# Patient Record
Sex: Female | Born: 1975
Health system: Southern US, Community
[De-identification: ages and names within clinical notes are randomized; demographics above are authoritative.]

## PROBLEM LIST (undated history)

## (undated) DIAGNOSIS — D229 Melanocytic nevi, unspecified: Secondary | ICD-10-CM

## (undated) DIAGNOSIS — M6208 Separation of muscle (nontraumatic), other site: Secondary | ICD-10-CM

## (undated) DIAGNOSIS — Z3490 Encounter for supervision of normal pregnancy, unspecified, unspecified trimester: Secondary | ICD-10-CM

## (undated) DIAGNOSIS — Z8759 Personal history of other complications of pregnancy, childbirth and the puerperium: Secondary | ICD-10-CM

## (undated) HISTORY — DX: Personal history of other complications of pregnancy, childbirth and the puerperium: Z87.59

## (undated) HISTORY — DX: Melanocytic nevi, unspecified: D22.9

## (undated) HISTORY — DX: Separation of muscle (nontraumatic), other site: M62.08

---

## 2006-04-30 LAB — HM MAMMOGRAPHY: HM Mammogram: NEGATIVE

## 2008-04-30 HISTORY — PX: ECTOPIC PREGNANCY SURGERY: SHX613

## 2009-05-08 ENCOUNTER — Inpatient Hospital Stay (HOSPITAL_COMMUNITY): Admission: AD | Admit: 2009-05-08 | Discharge: 2009-05-12 | Payer: Self-pay | Admitting: Obstetrics and Gynecology

## 2010-07-16 LAB — COMPREHENSIVE METABOLIC PANEL
ALT: 19 U/L (ref 0–35)
AST: 39 U/L — ABNORMAL HIGH (ref 0–37)
Albumin: 2.3 g/dL — ABNORMAL LOW (ref 3.5–5.2)
Alkaline Phosphatase: 185 U/L — ABNORMAL HIGH (ref 39–117)
Calcium: 8.8 mg/dL (ref 8.4–10.5)
GFR calc Af Amer: >60 mL/min (ref >60)
Glucose, Bld: 98 mg/dL (ref 70–99)
Potassium: 4.4 mEq/L (ref 3.5–5.1)
Sodium: 133 mEq/L — ABNORMAL LOW (ref 135–145)
Total Protein: 5.8 g/dL — ABNORMAL LOW (ref 6.0–8.3)

## 2010-07-16 LAB — CBC
HCT: 31.7 % — ABNORMAL LOW (ref 36.0–46.0)
HCT: 33 % — ABNORMAL LOW (ref 36.0–46.0)
Hemoglobin: 11.2 g/dL — ABNORMAL LOW (ref 12.0–15.0)
Hemoglobin: 9.6 g/dL — ABNORMAL LOW (ref 12.0–15.0)
MCHC: 33.8 g/dL (ref 30.0–36.0)
MCHC: 34.1 g/dL (ref 30.0–36.0)
Platelets: 170 10*3/uL (ref 150–400)
Platelets: 190 10*3/uL (ref 150–400)
Platelets: 268 10*3/uL (ref 150–400)
RDW: 15.3 % (ref 11.5–15.5)
RDW: 15.7 % — ABNORMAL HIGH (ref 11.5–15.5)
WBC: 5.9 10*3/uL (ref 4.0–10.5)

## 2010-07-16 LAB — RH IMMUNE GLOB WKUP(>/=20WKS)(NOT WOMEN'S HOSP): Fetal Screen: NEGATIVE

## 2010-10-24 ENCOUNTER — Other Ambulatory Visit (INDEPENDENT_AMBULATORY_CARE_PROVIDER_SITE_OTHER): Payer: BC Managed Care – PPO

## 2010-10-24 ENCOUNTER — Ambulatory Visit (INDEPENDENT_AMBULATORY_CARE_PROVIDER_SITE_OTHER): Payer: BC Managed Care – PPO | Admitting: Gastroenterology

## 2010-10-24 ENCOUNTER — Encounter: Payer: Self-pay | Admitting: Gastroenterology

## 2010-10-24 DIAGNOSIS — R109 Unspecified abdominal pain: Secondary | ICD-10-CM

## 2010-10-24 DIAGNOSIS — K469 Unspecified abdominal hernia without obstruction or gangrene: Secondary | ICD-10-CM

## 2010-10-24 LAB — CBC WITH DIFFERENTIAL/PLATELET
Basophils Absolute: 0 10*3/uL (ref 0.0–0.1)
HCT: 35.3 % — ABNORMAL LOW (ref 36.0–46.0)
Hemoglobin: 11.9 g/dL — ABNORMAL LOW (ref 12.0–15.0)
Lymphs Abs: 1.9 10*3/uL (ref 0.7–4.0)
MCHC: 33.8 g/dL (ref 30.0–36.0)
MCV: 83.9 fl (ref 78.0–100.0)
Monocytes Absolute: 0.3 10*3/uL (ref 0.1–1.0)
Monocytes Relative: 4.4 % (ref 3.0–12.0)
Neutro Abs: 5.3 10*3/uL (ref 1.4–7.7)
Platelets: 370 10*3/uL (ref 150.0–400.0)
RDW: 13.3 % (ref 11.5–14.6)

## 2010-10-24 LAB — COMPREHENSIVE METABOLIC PANEL
ALT: 13 U/L (ref 0–35)
Alkaline Phosphatase: 71 U/L (ref 39–117)
CO2: 28 mEq/L (ref 19–32)
Creatinine, Ser: 0.5 mg/dL (ref 0.4–1.2)
GFR: 137.19 mL/min (ref >60.00)
Sodium: 139 mEq/L (ref 135–145)
Total Bilirubin: 0.3 mg/dL (ref 0.3–1.2)
Total Protein: 7.1 g/dL (ref 6.0–8.3)

## 2010-10-24 NOTE — Patient Instructions (Addendum)
You will be set up for a CT scan of abdomen and pelvis with IV and oral contrast for abdominal pain, ? abd wall hernia. We will set up referral to [facility] for likely symptomatic hernia. You will have labs checked today in the basement lab.  Please head down after you check out with the front desk  (cbc, cmet). You have been scheduled for a CT scan of the abdomen and pelvis at [facility] ([address]---this is in the same building as Architectural technologist).   You are scheduled on  10/25/10 at 10 am. You should arrive 15 minutes prior to your appointment time for registration. Please follow the written instructions below on the day of your exam:  1) Do not eat or drink anything after 6 am  (4 hours prior to your test) 2) You have been given 2 bottles of oral contrast to drink. The solution may taste better if refrigerated, but do NOT add ice or any other liquid to this solution. Shake             well before drinking.  Drink 1 bottle of contrast @ 8 am (2 hours prior to your exam)  Drink 1 bottle of contrast @ 7 am (1 hour prior to your exam)  You may take any medications as prescribed with a small amount of water except for the following: Metformin, Glucophage, Glucovance, Avandamet, Riomet, Fortamet, Actoplus Met, Janumet, Glumetza or Metaglip. The above medications must be held the day of the exam and 48 hours after the exam.  The purpose of you drinking the oral contrast is to aid in the visualization of your intestinal tract. The contrast solution may cause some diarrhea. Before your exam is started, you will be given a small amount of fluid to drink. Depending on your individual set of symptoms, you may also receive an intravenous injection of x-ray contrast/dye.  If you have any questions regarding your exam or if you need to reschedule, you may call the CT department at [phone] between the hours of 8:00 am and 5:00 pm,  Monday-Friday.  ________________________________________________________________________

## 2010-10-24 NOTE — Progress Notes (Signed)
HPI: This is a  very pleasant 35 year old woman who has had 4-5 months sever epigastric discomfort, feels a hard lump (ball) post prandially.  No particular foods cause it.  Just about every meal will cause it.  She is nervous that she may have a tumor.  Overall she has lost weight recently.  Will hurt for 2 hours.  Passing gas helps.  Belching helps somewhat.  She has not had any imaging or labs for it.    She has had friends examine her belly when she has this episode and they can also feel a protruding lump.  She gets pyrosis periodically 1 every 2 weeks (late or spicey food).  Now she takes tums as needed.  No dysphagia.  No vomiting.  No overt GI bleeding.  No stomach problems in family  c section 1 year ago.    Ectopic pregnancy a year prior  She will sometimes eat late at night.    Review of systems: Pertinent positive and negative review of systems were noted in the above HPI section.  All other review of systems was otherwise negative.   Past Medical History  Diagnosis Date  . Hx of ectopic pregnancy     Past Surgical History  Procedure Date  . Cesarean section     x 1   . Ectopic pregnancy surgery      reports that she has never smoked. She has never used smokeless tobacco. She reports that she drinks alcohol. She reports that she does not use illicit drugs.  family history is negative for Colon cancer.    Current Medications, Allergies were all reviewed with the patient via [facility] electronic medical record system.    Physical Exam: BP 102/64  Pulse 88  Ht 5\' 2"  (1.575 m)  Wt 125 lb (56.7 kg)  BMI 22.86 kg/m2 Constitutional: generally well-appearing Psychiatric: alert and oriented x3 Eyes: extraocular movements intact Mouth: oral pharynx moist, no lesions Neck: supple no lymphadenopathy Cardiovascular: heart regular rate and rhythm Lungs: clear to auscultation bilaterally Abdomen: soft, nontender, nondistended, no obvious ascites, no peritoneal  signs, normal bowel sounds: Very lax midline abdominal wall muscles with fairly clear defect periumbilically Extremities: no lower extremity edema bilaterally Skin: no lesions on visible extremities    Assessment and plan: 34 y.o. female with intermittent abdominal discomfort, likely from symptomatic abdominal wall, periumbilical hernia  I think her discomforts are all from an symptomatic hernia in her abdominal wall, periumbilically. She is very nervous about the chances that she has a tumor or some sort of cancer which I think is highly unlikely but a CT scan is not unreasonable to rule out other causes as well. We will set a CT scan up for her after getting basic labs including a CBC complete metabolic profile. We will also begin to arrange for Gen. surgical referral.

## 2010-10-27 ENCOUNTER — Telehealth: Payer: Self-pay | Admitting: Gastroenterology

## 2010-10-27 ENCOUNTER — Ambulatory Visit (INDEPENDENT_AMBULATORY_CARE_PROVIDER_SITE_OTHER)
Admission: RE | Admit: 2010-10-27 | Discharge: 2010-10-27 | Disposition: A | Discharge status: Home / Self Care | Payer: BC Managed Care – PPO | Source: Ambulatory Visit | Attending: Gastroenterology | Admitting: Gastroenterology

## 2010-10-27 DIAGNOSIS — K469 Unspecified abdominal hernia without obstruction or gangrene: Secondary | ICD-10-CM

## 2010-10-27 DIAGNOSIS — R109 Unspecified abdominal pain: Secondary | ICD-10-CM

## 2010-10-27 MED ORDER — IOHEXOL 300 MG/ML  SOLN
100.0000 mL | Freq: Once | INTRAMUSCULAR | Status: AC | PRN
  Administered 2010-10-27: 100 mL via INTRAVENOUS

## 2010-10-27 NOTE — Telephone Encounter (Signed)
Left message on machine to call back  

## 2010-10-27 NOTE — Telephone Encounter (Signed)
Pt aware results not available yet, I will call her as soon as Dr Christella Hartigan reviews

## 2010-11-13 ENCOUNTER — Ambulatory Visit (INDEPENDENT_AMBULATORY_CARE_PROVIDER_SITE_OTHER): Payer: BC Managed Care – PPO | Admitting: Surgery

## 2010-11-13 ENCOUNTER — Encounter (INDEPENDENT_AMBULATORY_CARE_PROVIDER_SITE_OTHER): Payer: Self-pay | Admitting: Surgery

## 2010-11-13 VITALS — BP 98/70 | HR 68 | Temp 96.8°F | Ht 62.5 in | Wt 125.6 lb

## 2010-11-13 DIAGNOSIS — M6208 Separation of muscle (nontraumatic), other site: Secondary | ICD-10-CM

## 2010-11-13 DIAGNOSIS — M6200 Separation of muscle (nontraumatic), unspecified site: Secondary | ICD-10-CM

## 2010-11-13 DIAGNOSIS — K439 Ventral hernia without obstruction or gangrene: Secondary | ICD-10-CM

## 2010-11-13 HISTORY — DX: Separation of muscle (nontraumatic), other site: M62.08

## 2010-11-13 NOTE — Progress Notes (Signed)
Subjective:     Patient ID: Shelley Long, female   DOB: 1975/08/26, 35 y.o.   MRN: [id number]  HPI  The patient notes a 6 month history of periumbilical pain after eating. She notes swelling there. She was worried that there was a mass there.  Friends and family have noticed as well. She is worried she may have a tumor. She saw Dr. Christella Hartigan. He is concerned hernia. He got a CAT scan which did not show a definite hernia but some laxity periumbilically. He sent the patient to me for consideration of surgery for a hernia.  Patient denies any early satiety. No dysphasia. She had bad heartburn when she was pregnant with her child 2011, she does not feel that this is consistent with heartburn. She does know that spicy foods do bother her. She's not tried any medications for this. She often we'll get some burping and belching.  The pain starts in the abdomen. It then becomes more diffuse  centrally. It comes on quickly and last for several hours. There is no associated constipation or diarrhea. No nausea or vomiting. The pain does not radiate anywhere. It's in the middle. Does not go to the back. Is not associated really with running her activity. She is otherwise quite healthy.  Review of Systems  Constitutional: Negative for fever, chills, diaphoresis, appetite change and fatigue.  HENT: Positive for sore throat. Negative for nosebleeds, mouth sores, neck pain and neck stiffness.        ?Cyclical sore throat - stops after menses  Eyes: Negative for photophobia, discharge and visual disturbance.  Respiratory: Negative for cough, choking, chest tightness and shortness of breath.   Cardiovascular: Negative for chest pain and palpitations.  Gastrointestinal: Positive for abdominal pain and abdominal distention. Negative for nausea, vomiting, diarrhea, constipation, blood in stool, anal bleeding and rectal pain.       No dysphagia.  ?Mild reflux - not as bad as when pregnant.  Mild, taking no meds    Genitourinary: Negative for dysuria, urgency, frequency, hematuria, flank pain, vaginal bleeding, vaginal discharge, difficulty urinating, menstrual problem and pelvic pain.  Musculoskeletal: Negative for back pain, arthralgias and gait problem.  Skin: Negative for color change, pallor and rash.  Neurological: Negative for dizziness, speech difficulty, weakness and numbness.  Hematological: Negative for adenopathy. Does not bruise/bleed easily.  Psychiatric/Behavioral: Negative for confusion and agitation. The patient is not nervous/anxious.        Objective:   Physical Exam  Constitutional: She is oriented to person, place, and time. She appears well-developed and well-nourished. No distress.  HENT:  Head: Normocephalic.  Mouth/Throat: Oropharynx is clear and moist. No oropharyngeal exudate.  Eyes: Conjunctivae and EOM are normal. Pupils are equal, round, and reactive to light. No scleral icterus.  Neck: Normal range of motion. Neck supple. No tracheal deviation present.  Cardiovascular: Normal rate, regular rhythm and intact distal pulses.   Pulmonary/Chest: Effort normal and breath sounds normal. No respiratory distress. She exhibits no tenderness.  Abdominal: Soft. Bowel sounds are normal. She exhibits no distension. There is tenderness. There is no rebound and no guarding. Hernia confirmed negative in the right inguinal area and confirmed negative in the left inguinal area.       Periumb diastsis recti persistent while standing.  VWH within this.  Soft/reducible.  Post-partum stretch marks  Genitourinary: No vaginal discharge found.  Musculoskeletal: Normal range of motion. She exhibits no tenderness.  Lymphadenopathy:    She has no cervical adenopathy.  Right: No inguinal adenopathy present.       Left: No inguinal adenopathy present.  Neurological: She is alert and oriented to person, place, and time. No cranial nerve deficit. She exhibits normal muscle tone. Coordination  normal.  Skin: Skin is warm and dry. No rash noted. She is not diaphoretic. No erythema.  Psychiatric: She has a normal mood and affect. Her behavior is normal. Judgment and thought content normal.   CT scan does not show any obstruction. They did not see definite hernia. However I see laxity periumbilically that makes me suspect that she does. Small pancreatic cystic mass. Agree with followup in a year. No other tumors or concerns.    Assessment:     Periumbilical ventral hernia in the setting of diastasis recti.  Abdominal pain postprandial question reflux for rated versus mildly obstructive symptoms.   Plan:     I think she would benefit from earlier.. Is a good candidate for a laparoscopic approach. I may be able to primarily fix the diastases and then do an underlay repair.  I'm concerned that this will not solve all her symptoms. I think it needed to try to do a trial of PPI (omeprazole x3 weeks b.i.d.). If tat resolves all her symptoms, then that would be helpful. However I don't think it will solve all the issues..  With her belching and her postprandial pain I wonder about delayed gastric emptying her upper and reflex, but Dr. Christella Hartigan do not feel that that was issue.   It does not change the fact that she will need earlier. Some point. She will think about things and let me know.

## 2010-11-13 NOTE — Patient Instructions (Signed)
Try omeprazole (ex: Prilosec) 2x/day for 3 weeks to rule out reflux.  Maalox for burping/gassiness. Consider surgery to repair the ventral wall abdominal hernia in the setting of diastasis recti.

## 2010-12-19 ENCOUNTER — Encounter: Payer: Self-pay | Admitting: Internal Medicine

## 2010-12-19 ENCOUNTER — Ambulatory Visit (INDEPENDENT_AMBULATORY_CARE_PROVIDER_SITE_OTHER): Payer: BC Managed Care – PPO | Admitting: Internal Medicine

## 2010-12-19 VITALS — BP 100/60 | HR 59 | Temp 97.9°F | Resp 16 | Ht 59.0 in | Wt 126.0 lb

## 2010-12-19 DIAGNOSIS — K862 Cyst of pancreas: Secondary | ICD-10-CM

## 2010-12-19 DIAGNOSIS — N644 Mastodynia: Secondary | ICD-10-CM

## 2010-12-19 DIAGNOSIS — R109 Unspecified abdominal pain: Secondary | ICD-10-CM | POA: Insufficient documentation

## 2010-12-19 DIAGNOSIS — R5381 Other malaise: Secondary | ICD-10-CM

## 2010-12-19 DIAGNOSIS — D649 Anemia, unspecified: Secondary | ICD-10-CM

## 2010-12-19 DIAGNOSIS — Z803 Family history of malignant neoplasm of breast: Secondary | ICD-10-CM

## 2010-12-19 DIAGNOSIS — R5383 Other fatigue: Secondary | ICD-10-CM

## 2010-12-19 NOTE — Patient Instructions (Signed)
OK to stop Maalox and try Simethicone  Trial of stopping dairy in diet   CPE 1-2 months  Will Schedule diagnsotic mammogram

## 2010-12-19 NOTE — Progress Notes (Signed)
Subjective:    Patient ID: Shelley Long, female    DOB: 01-11-1976, 35 y.o.   MRN: [id number]  HPI  New pt here for first visit.  IN [place] for the past 2 years.  No primary MD.  GYN Dr. Jackelyn Knife.  Pt has seen Dr. Christella Hartigan for sharp crampy supra umbilical pain for 3-4 months .  He felt that it may be due to a ventral hernia and she was evaluted by Dr. Michaell Cowing.  CT showed abd bulging but no frank herniation.  She reports she has been taking Maalox since then and feels much better so she did not have any surgical intervention.  She still has occasional gas type pain and does not like the taste of Maalox and would like to try something different.  She follows and ethnic [place] diet  See CBC from 6/12  Mild anemia.  Has 38 month old at home and had anemia in past ??? Associated with pregnancy.   CT also shows what appears to be a pancreatic cyst just over 1 cm. I reviewed with pt.  She is aware and states she will follow up in one year with Dr. Christella Hartigan.   Also reports bilateral sharp breast pains intermittantly.  No redness or edema of either breast .  Stopped breast feeding over a year ago.  FH pos for breast CA in mother and aunt   No Known Allergies Past Medical History  Diagnosis Date  . Hx of ectopic pregnancy   . Abdominal pain   . Skin moles     abnormal per medical history form dated 11/13/10.   Past Surgical History  Procedure Date  . Cesarean section     x 1   . Ectopic pregnancy surgery 2010   History   Social History  . Marital Status: Married    Spouse Name: N/A    Number of Children: N/A  . Years of Education: N/A   Occupational History  . CDW     Social History Main Topics  . Smoking status: Never Smoker   . Smokeless tobacco: Never Used  . Alcohol Use: Yes     social 1/2 drink per week  . Drug Use: No  . Sexually Active: Yes -- Female partner(s)   Other Topics Concern  . Not on file   Social History Narrative   0 Caffeine drinks    Family History  Problem  Relation Age of Onset  . Colon cancer Neg Hx   . Cancer Mother     breast - stage 0  . Cancer Maternal Aunt     breast cancer  . Cancer Maternal Aunt     ovarian  . Cancer Maternal Grandmother     breast or uterine   Patient Active Problem List  Diagnoses  . Hernia, ventral  . Diastasis recti  . Breast pain  . Abdominal cramping   No current outpatient prescriptions on file prior to visit.      Review of Systems    No N/V/D  No change in colorof stool.  No weight change Objective:   Physical ExamPhysical Exam  Nursing note and vitals reviewed.  Constitutional: She is oriented to person, place, and time. She appears well-developed and well-nourished.  HENT:  Head: Normocephalic and atraumatic. Neck  No thyromegaly or thyroid nodule palpated Chest:  Bilateral breast exam reveals no nipple discharge,  No discrete masses and no axillary adenopathy bilaterally.  No redness or edema of skin  Cardiovascular: Normal rate and  regular rhythm. Exam reveals no gallop and no friction rub.  No murmur heard.  Pulmonary/Chest: Breath sounds normal. She has no wheezes. She has no rales. ABD.  Easily reducible bulging above umbilicus.  Clinically  consistant with diastasis recti  Neurological: She is alert and oriented to person, place, and time.  Skin: Skin is warm and dry.  Psychiatric: She has a normal mood and affect. Her behavior is normal.              Assessment & Plan:  1)  ABD cramping:  Follow diet closely .  OK to try lactose free diet for the next 4-6 weeks.  OK to stop Maalox and try OTC simethicone.  Call if not better.  Must also consider celiac disease in setting of anemia.   2)  Mild anemia:  Check CBC today and TSH 3)  Bilateral breast pain:  Will get diagnsotic mammogram today 4)  H/O diastasis recti 5)  H/O pancreatic Cyst.  See CT needs repeat imaging in one year  I spent 45 minutes with this pt

## 2010-12-20 LAB — CBC WITH DIFFERENTIAL/PLATELET
Hemoglobin: 12.3 g/dL (ref 12.0–15.0)
Lymphocytes Relative: 32 % (ref 12–46)
Lymphs Abs: 2.2 10*3/uL (ref 0.7–4.0)
MCV: 86.3 fL (ref 78.0–100.0)
Monocytes Relative: 7 % (ref 3–12)
Neutrophils Relative %: 58 % (ref 43–77)
Platelets: 392 10*3/uL (ref 150–400)
RBC: 4.54 MIL/uL (ref 3.87–5.11)
WBC: 6.8 10*3/uL (ref 4.0–10.5)

## 2010-12-20 LAB — TSH: TSH: 1.338 u[IU]/mL (ref 0.350–4.500)

## 2010-12-21 ENCOUNTER — Encounter: Payer: Self-pay | Admitting: Emergency Medicine

## 2010-12-25 ENCOUNTER — Ambulatory Visit
Admission: RE | Admit: 2010-12-25 | Discharge: 2010-12-25 | Disposition: A | Discharge status: Home / Self Care | Payer: BC Managed Care – PPO | Source: Ambulatory Visit | Attending: Internal Medicine | Admitting: Internal Medicine

## 2010-12-25 DIAGNOSIS — N644 Mastodynia: Secondary | ICD-10-CM

## 2010-12-26 ENCOUNTER — Encounter: Payer: BC Managed Care – PPO | Admitting: Internal Medicine

## 2010-12-27 ENCOUNTER — Telehealth: Payer: Self-pay | Admitting: Internal Medicine

## 2010-12-27 NOTE — Telephone Encounter (Signed)
Pt would like to discuss her labs that was mailed to her.  Call back phone number for pt [phone].

## 2010-12-28 NOTE — Telephone Encounter (Signed)
Spoke with pt, explained labs

## 2011-02-26 ENCOUNTER — Ambulatory Visit: Payer: BC Managed Care – PPO | Admitting: Internal Medicine

## 2011-02-28 ENCOUNTER — Telehealth: Payer: Self-pay | Admitting: Internal Medicine

## 2011-02-28 NOTE — Telephone Encounter (Signed)
Spoke with Shelley Long.  She states that she saw a GI MD here, but she was not confident in his assessment or plan for her care.  She has done research and spoken with her friends (also in the medical field) and has decided she would like to see Dr. Renie Ora at [facility].  Dr. Jolyn Lent office requires a referral from her PCP's office.  Requests that we make referral.   Dr Renie Ora Phone [phone] Fax [phone]

## 2011-02-28 NOTE — Telephone Encounter (Signed)
Pt would like a referral to a GI specialist to [facility]. She can go deeper into details when you give her a call. She can be reach at [phone].

## 2011-03-02 NOTE — Telephone Encounter (Signed)
OK for referral to [facility] MD for supraumbilical abd pain second opinion.  Please call pt and tellher to be sure to have all GI and surgery notes sent to [facility] MD

## 2011-03-05 NOTE — Telephone Encounter (Signed)
Referral faxed to [facility] for appt

## 2011-04-01 LAB — OB RESULTS CONSOLE GC/CHLAMYDIA
Chlamydia: NEGATIVE
Gonorrhea: NEGATIVE

## 2011-04-01 LAB — OB RESULTS CONSOLE HEPATITIS B SURFACE ANTIGEN: Hepatitis B Surface Ag: NEGATIVE

## 2011-04-01 LAB — OB RESULTS CONSOLE RPR: RPR: NONREACTIVE

## 2011-04-01 LAB — OB RESULTS CONSOLE HIV ANTIBODY (ROUTINE TESTING): HIV: NONREACTIVE

## 2011-05-01 NOTE — L&D Delivery Note (Signed)
Delivery Note Pt with intermittent lates and severe variables in pushing, VAVD d/w pt, Bell vaccuum applied with 2 pulls infant delivered.  At 6:23 PM a viable female was delivered via VBAC, Vacuum Assisted (Presentation: OA;LOT  ).  APGAR: 5, 7; weight P.   Placenta status: Intact, Spontaneous.  Cord: 3 vessels with the following complications: Nuchal. NICU present at delivery, mother with chorio, baby to NICU  Anesthesia: Epidural  Episiotomy: none Lacerations: 3rd degree;Vaginal Suture Repair: 2.0 3.0 vicryl rapide Est. Blood Loss (mL): 500  Mom to postpartum.  Baby to NICU.  [name],Shelley Reesman 11/30/2011, 7:08 PM  Br/ B neg

## 2011-10-30 ENCOUNTER — Telehealth: Payer: Self-pay

## 2011-10-30 NOTE — Telephone Encounter (Signed)
Message copied by Donata Duff on Tue Oct 30, 2011  8:01 AM ------      Message from: Donata Duff      Created: Mon Oct 30, 2010  4:35 PM       Repeat CT panc protocol see 10/30/10 phone note

## 2011-10-31 NOTE — Telephone Encounter (Signed)
Pt is 8 months pregnant and will call after the baby is born to set the CT up

## 2011-10-31 NOTE — Telephone Encounter (Signed)
ok 

## 2011-11-30 ENCOUNTER — Encounter (HOSPITAL_COMMUNITY): Payer: Self-pay | Admitting: Anesthesiology

## 2011-11-30 ENCOUNTER — Encounter (HOSPITAL_COMMUNITY): Payer: Self-pay

## 2011-11-30 ENCOUNTER — Inpatient Hospital Stay (HOSPITAL_COMMUNITY): Payer: BC Managed Care – PPO | Admitting: Anesthesiology

## 2011-11-30 ENCOUNTER — Inpatient Hospital Stay (HOSPITAL_COMMUNITY)
Admission: AD | Admit: 2011-11-30 | Discharge: 2011-12-02 | DRG: 372 | Disposition: A | Discharge status: Home / Self Care | Payer: BC Managed Care – PPO | Source: Ambulatory Visit | Attending: Obstetrics and Gynecology | Admitting: Obstetrics and Gynecology

## 2011-11-30 DIAGNOSIS — Z3490 Encounter for supervision of normal pregnancy, unspecified, unspecified trimester: Secondary | ICD-10-CM

## 2011-11-30 DIAGNOSIS — O09529 Supervision of elderly multigravida, unspecified trimester: Secondary | ICD-10-CM | POA: Diagnosis present

## 2011-11-30 HISTORY — DX: Encounter for supervision of normal pregnancy, unspecified, unspecified trimester: Z34.90

## 2011-11-30 LAB — CBC
Hemoglobin: 11.7 g/dL — ABNORMAL LOW (ref 12.0–15.0)
MCH: 27.8 pg (ref 26.0–34.0)
MCV: 83.4 fL (ref 78.0–100.0)
RBC: 4.21 MIL/uL (ref 3.87–5.11)

## 2011-11-30 LAB — OB RESULTS CONSOLE ABO/RH: RH Type: NEGATIVE

## 2011-11-30 MED ORDER — BUPIVACAINE HCL (PF) 0.25 % IJ SOLN
INTRAMUSCULAR | Status: DC | PRN
  Administered 2011-11-30 (×2): 5 mL

## 2011-11-30 MED ORDER — GENTAMICIN SULFATE 40 MG/ML IJ SOLN
150.0000 mg | Freq: Once | INTRAVENOUS | Status: AC
  Administered 2011-11-30: 150 mg via INTRAVENOUS
  Filled 2011-11-30: qty 3.75

## 2011-11-30 MED ORDER — LACTATED RINGERS IV SOLN: 500.0000 mL | Freq: Once | INTRAVENOUS | Status: DC

## 2011-11-30 MED ORDER — SIMETHICONE 80 MG PO CHEW: 80.0000 mg | CHEWABLE_TABLET | ORAL | Status: DC | PRN

## 2011-11-30 MED ORDER — OXYTOCIN 40 UNITS IN LACTATED RINGERS INFUSION - SIMPLE MED
62.5000 mL/h | Freq: Once | INTRAVENOUS | Status: AC
  Administered 2011-11-30: 62.5 mL/h via INTRAVENOUS

## 2011-11-30 MED ORDER — ONDANSETRON HCL 4 MG/2ML IJ SOLN: 4.0000 mg | INTRAMUSCULAR | Status: DC | PRN

## 2011-11-30 MED ORDER — CITRIC ACID-SODIUM CITRATE 334-500 MG/5ML PO SOLN
30.0000 mL | ORAL | Status: DC | PRN
  Filled 2011-11-30: qty 15

## 2011-11-30 MED ORDER — ONDANSETRON HCL 4 MG PO TABS: 4.0000 mg | ORAL_TABLET | ORAL | Status: DC | PRN

## 2011-11-30 MED ORDER — LACTATED RINGERS IV SOLN
INTRAVENOUS | Status: DC
  Administered 2011-11-30: 125 mL/h via INTRAVENOUS
  Administered 2011-11-30: 125 mL via INTRAVENOUS

## 2011-11-30 MED ORDER — OXYCODONE-ACETAMINOPHEN 5-325 MG PO TABS
1.0000 | ORAL_TABLET | ORAL | Status: DC | PRN
Start: 2011-11-30 — End: 2011-11-30

## 2011-11-30 MED ORDER — EPHEDRINE 5 MG/ML INJ: 10.0000 mg | INTRAVENOUS | Status: DC | PRN

## 2011-11-30 MED ORDER — ACETAMINOPHEN 325 MG PO TABS: 650.0000 mg | ORAL_TABLET | ORAL | Status: DC | PRN

## 2011-11-30 MED ORDER — IBUPROFEN 600 MG PO TABS
600.0000 mg | ORAL_TABLET | Freq: Four times a day (QID) | ORAL | Status: DC
  Administered 2011-11-30 – 2011-12-02 (×8): 600 mg via ORAL
  Filled 2011-11-30 (×8): qty 1

## 2011-11-30 MED ORDER — GENTAMICIN SULFATE 40 MG/ML IJ SOLN
140.0000 mg | Freq: Three times a day (TID) | INTRAVENOUS | Status: DC
  Filled 2011-11-30 (×2): qty 3.5

## 2011-11-30 MED ORDER — TERBUTALINE SULFATE 1 MG/ML IJ SOLN: 0.2500 mg | Freq: Once | INTRAMUSCULAR | Status: DC | PRN

## 2011-11-30 MED ORDER — GENTAMICIN SULFATE 40 MG/ML IJ SOLN
140.0000 mg | Freq: Three times a day (TID) | INTRAVENOUS | Status: DC
  Filled 2011-11-30: qty 3.5

## 2011-11-30 MED ORDER — LIDOCAINE HCL (PF) 1 % IJ SOLN
INTRAMUSCULAR | Status: DC | PRN
  Administered 2011-11-30 (×2): 5 mL

## 2011-11-30 MED ORDER — LACTATED RINGERS IV BOLUS (SEPSIS)
500.0000 mL | Freq: Once | INTRAVENOUS | Status: AC
  Administered 2011-11-30: 10:00:00 via INTRAVENOUS

## 2011-11-30 MED ORDER — CALCIUM CARBONATE ANTACID 500 MG PO CHEW: 1.0000 | CHEWABLE_TABLET | Freq: Every day | ORAL | Status: DC

## 2011-11-30 MED ORDER — TETANUS-DIPHTH-ACELL PERTUSSIS 5-2.5-18.5 LF-MCG/0.5 IM SUSP
0.5000 mL | Freq: Once | INTRAMUSCULAR | Status: DC
  Filled 2011-11-30: qty 0.5

## 2011-11-30 MED ORDER — FLEET ENEMA 7-19 GM/118ML RE ENEM: 1.0000 | ENEMA | RECTAL | Status: DC | PRN

## 2011-11-30 MED ORDER — BUTORPHANOL TARTRATE 1 MG/ML IJ SOLN: 2.0000 mg | INTRAMUSCULAR | Status: DC | PRN

## 2011-11-30 MED ORDER — LANOLIN HYDROUS EX OINT: TOPICAL_OINTMENT | CUTANEOUS | Status: DC | PRN

## 2011-11-30 MED ORDER — ZOLPIDEM TARTRATE 5 MG PO TABS: 5.0000 mg | ORAL_TABLET | Freq: Every evening | ORAL | Status: DC | PRN

## 2011-11-30 MED ORDER — DIPHENHYDRAMINE HCL 25 MG PO CAPS: 25.0000 mg | ORAL_CAPSULE | Freq: Four times a day (QID) | ORAL | Status: DC | PRN

## 2011-11-30 MED ORDER — OXYTOCIN BOLUS FROM INFUSION
250.0000 mL | Freq: Once | INTRAVENOUS | Status: DC
  Filled 2011-11-30: qty 500

## 2011-11-30 MED ORDER — LIDOCAINE HCL (PF) 1 % IJ SOLN
30.0000 mL | INTRAMUSCULAR | Status: DC | PRN
  Filled 2011-11-30: qty 30

## 2011-11-30 MED ORDER — FAMOTIDINE 20 MG PO TABS: 20.0000 mg | ORAL_TABLET | Freq: Every day | ORAL | Status: DC

## 2011-11-30 MED ORDER — IBUPROFEN 600 MG PO TABS
600.0000 mg | ORAL_TABLET | Freq: Four times a day (QID) | ORAL | Status: DC | PRN
  Filled 2011-11-30: qty 1

## 2011-11-30 MED ORDER — WITCH HAZEL-GLYCERIN EX PADS
1.0000 "application " | MEDICATED_PAD | CUTANEOUS | Status: DC | PRN
  Administered 2011-12-01: 1 via TOPICAL

## 2011-11-30 MED ORDER — SODIUM CHLORIDE 0.9 % IV SOLN
2.0000 g | Freq: Four times a day (QID) | INTRAVENOUS | Status: DC
  Administered 2011-11-30: 2 g via INTRAVENOUS
  Filled 2011-11-30 (×2): qty 2000

## 2011-11-30 MED ORDER — LACTATED RINGERS IV SOLN
INTRAVENOUS | Status: DC
  Administered 2011-12-01: 03:00:00 via INTRAVENOUS

## 2011-11-30 MED ORDER — OXYTOCIN 40 UNITS IN LACTATED RINGERS INFUSION - SIMPLE MED
1.0000 m[IU]/min | INTRAVENOUS | Status: DC
  Filled 2011-11-30: qty 1000

## 2011-11-30 MED ORDER — FENTANYL 2.5 MCG/ML BUPIVACAINE 1/10 % EPIDURAL INFUSION (WH - ANES)
14.0000 mL/h | INTRAMUSCULAR | Status: DC
  Administered 2011-11-30 (×2): 14 mL/h via EPIDURAL
  Filled 2011-11-30 (×2): qty 60

## 2011-11-30 MED ORDER — EPHEDRINE 5 MG/ML INJ
10.0000 mg | INTRAVENOUS | Status: DC | PRN
  Filled 2011-11-30: qty 4

## 2011-11-30 MED ORDER — SENNOSIDES-DOCUSATE SODIUM 8.6-50 MG PO TABS
2.0000 | ORAL_TABLET | Freq: Every day | ORAL | Status: DC
  Administered 2011-11-30 – 2011-12-02 (×3): 2 via ORAL

## 2011-11-30 MED ORDER — BENZOCAINE-MENTHOL 20-0.5 % EX AERO
1.0000 "application " | INHALATION_SPRAY | CUTANEOUS | Status: DC | PRN
  Filled 2011-11-30: qty 56

## 2011-11-30 MED ORDER — OXYCODONE-ACETAMINOPHEN 5-325 MG PO TABS
1.0000 | ORAL_TABLET | ORAL | Status: DC | PRN
  Administered 2011-11-30 – 2011-12-01 (×4): 1 via ORAL
  Administered 2011-12-01: 2 via ORAL
  Administered 2011-12-01 – 2011-12-02 (×3): 1 via ORAL
  Administered 2011-12-02: 2 via ORAL
  Administered 2011-12-02 (×2): 1 via ORAL
  Filled 2011-11-30 (×10): qty 1
  Filled 2011-11-30 (×2): qty 2
  Filled 2011-11-30 (×2): qty 1

## 2011-11-30 MED ORDER — PRENATAL MULTIVITAMIN CH: 1.0000 | ORAL_TABLET | Freq: Every day | ORAL | Status: DC

## 2011-11-30 MED ORDER — ONDANSETRON HCL 4 MG/2ML IJ SOLN: 4.0000 mg | Freq: Four times a day (QID) | INTRAMUSCULAR | Status: DC | PRN

## 2011-11-30 MED ORDER — PRENATAL MULTIVITAMIN CH
1.0000 | ORAL_TABLET | Freq: Every day | ORAL | Status: DC
  Administered 2011-12-01 – 2011-12-02 (×2): 1 via ORAL
  Filled 2011-11-30 (×2): qty 1

## 2011-11-30 MED ORDER — DIBUCAINE 1 % RE OINT
1.0000 "application " | TOPICAL_OINTMENT | RECTAL | Status: DC | PRN
  Filled 2011-11-30: qty 28

## 2011-11-30 MED ORDER — PHENYLEPHRINE 40 MCG/ML (10ML) SYRINGE FOR IV PUSH (FOR BLOOD PRESSURE SUPPORT)
80.0000 ug | PREFILLED_SYRINGE | INTRAVENOUS | Status: DC | PRN
  Filled 2011-11-30: qty 5

## 2011-11-30 MED ORDER — DIPHENHYDRAMINE HCL 50 MG/ML IJ SOLN: 12.5000 mg | INTRAMUSCULAR | Status: DC | PRN

## 2011-11-30 MED ORDER — PHENYLEPHRINE 40 MCG/ML (10ML) SYRINGE FOR IV PUSH (FOR BLOOD PRESSURE SUPPORT): 80.0000 ug | PREFILLED_SYRINGE | INTRAVENOUS | Status: DC | PRN

## 2011-11-30 MED ORDER — LACTATED RINGERS IV SOLN
500.0000 mL | INTRAVENOUS | Status: DC | PRN
  Administered 2011-11-30 (×2): 500 mL via INTRAVENOUS

## 2011-11-30 NOTE — Progress Notes (Signed)
Patient ID: Shelley Long, female   DOB: Aug 13, 1975, 36 y.o.   MRN: [id number] AROM for clear fluid without diff/comp 4/90/-1 FHTs 150's, mod var toco Q 2-4 min

## 2011-11-30 NOTE — MAU Note (Signed)
Patient state she is having contractions every 5 minutes. Denies any bleeding or leaking and reports good fetal movement.

## 2011-11-30 NOTE — Progress Notes (Signed)
ANTIBIOTIC CONSULT NOTE - INITIAL  Pharmacy Consult for Gentamicin Indication: Maternal temp during labor/Suspected chorioamnionitis  No Known Allergies  Patient Measurements: Height: 5' 1.5" (156.2 cm) Weight: 162 lb 8 oz (73.71 kg) IBW/kg (Calculated) : 48.95  Adjusted Body Weight: 56.4kg  Vital Signs: Temp: 101.4 F (38.6 C) (08/02 1708) Temp src: Axillary (08/02 1708) BP: 124/72 mmHg (08/02 1733) Pulse Rate: 125  (08/02 1733)  Labs:  Basename 11/30/11 0930  WBC 13.3*  HGB 11.7*  PLT 274  LABCREA --  CREATININE --   Estimated Creatinine Clearance: 91.3 ml/min (by C-G formula based on Cr of 0.5).  Results from 2012. Estimated SCr=0.7 with estimated CrCL= approx 44ml/min  Medical History: Past Medical History  Diagnosis Date  . Hx of ectopic pregnancy   . Abdominal pain   . Skin moles     abnormal per medical history form dated 11/13/10.  . Normal pregnancy 11/30/2011    Medications:  Ampicillin 2 gram IV q6h Assessment: 36 yo F 39+ weeks with maternal temp of 101.4. Gentamicin started for suspected chorioamnionitis.  Goal of Therapy:  Gentamicin peaks 6-73mcg/ml and trough < 45mcg/ml  Plan:  1. Gentamicin 150mg  IV x 1 then 140mg  IV q8h. 2. Draw SCr if continued after delivery.. Last drawn in 2012. 3. Will continue to follow and assess need for clinical levels. Thanks!  Claybon Jabs 11/30/2011,5:54 PM

## 2011-11-30 NOTE — MAU Note (Signed)
Patient plans a TOLAC

## 2011-11-30 NOTE — Anesthesia Procedure Notes (Signed)
Epidural Patient location during procedure: OB Start time: 11/30/2011 12:51 PM  Staffing Anesthesiologist: Brayton Caves R Performed by: anesthesiologist   Preanesthetic Checklist Completed: patient identified, site marked, surgical consent, pre-op evaluation, timeout performed, IV checked, risks and benefits discussed and monitors and equipment checked  Epidural Patient position: sitting Prep: site prepped and draped and DuraPrep Patient monitoring: continuous pulse ox and blood pressure Approach: midline Injection technique: LOR air and LOR saline  Needle:  Needle type: Tuohy  Needle gauge: 17 G Needle length: 9 cm Needle insertion depth: 5 cm cm Catheter type: closed end flexible Catheter size: 19 Gauge Catheter at skin depth: 10 cm Test dose: negative  Assessment Events: blood not aspirated, injection not painful, no injection resistance, negative IV test and no paresthesia  Additional Notes Patient identified.  Risk benefits discussed including failed block, incomplete pain control, headache, nerve damage, paralysis, blood pressure changes, nausea, vomiting, reactions to medication both toxic or allergic, and postpartum back pain.  Patient expressed understanding and wished to proceed.  All questions were answered.  Sterile technique used throughout procedure and epidural site dressed with sterile barrier dressing. No paresthesia or other complications noted.The patient did not experience any signs of intravascular injection such as tinnitus or metallic taste in mouth nor signs of intrathecal spread such as rapid motor block. Please see nursing notes for vital signs.

## 2011-11-30 NOTE — H&P (Signed)
Shelley Long is a 36 y.o. female G3P1011 at 39+ with NR FHT and ctx.  +FM, no LOF, no VB, ctx increasing, intensity and frequency.  Uncomplicated PNC - Pt desires TOLAC, d/w pt r/b/a of TOLAC  Maternal Medical History:  Reason for admission: Reason for admission: contractions.  Contractions: Frequency: regular.    Fetal activity: Perceived fetal activity is normal.      OB History    Grav Para Term Preterm Abortions TAB SAB Ect Mult Living   3 1 1  1   1  1     G1 LTCS 8#3, female; arrest of dilitation; G2 ectopic, salpingectomy, G3 present - question of umbilical hernia; no STDs Past Medical History  Diagnosis Date  . Hx of ectopic pregnancy   . Abdominal pain   . Skin moles     abnormal per medical history form dated 11/13/10.   Past Surgical History  Procedure Date  . Cesarean section     x 1   . Ectopic pregnancy surgery 2010   Family History: family history includes Cancer in her maternal aunts, maternal grandmother, and mother.  There is no history of Colon cancer and Other. Social History:  reports that she has never smoked. She has never used smokeless tobacco. She reports that she drinks alcohol. She reports that she does not use illicit drugs.married Meds PNV All NKDA   Prenatal Transfer Tool  Maternal Diabetes: No Genetic Screening: Normal Maternal Ultrasounds/Referrals: Normal Fetal Ultrasounds or other Referrals:  None Maternal Substance Abuse:  No Significant Maternal Medications:  None Significant Maternal Lab Results:  Lab values include: Group B Strep negative Other Comments:  None  Review of Systems  Constitutional: Negative.   HENT: Negative.   Eyes: Negative.   Respiratory: Negative.   Cardiovascular: Negative.   Gastrointestinal: Negative.   Genitourinary: Negative.   Musculoskeletal: Negative.   Skin: Negative.   Neurological: Negative.   Psychiatric/Behavioral: Negative.     Dilation: 2 Effacement (%): 60 Station: -3 Exam by:: jolynn Blood  pressure 116/84, pulse 92, temperature 97.8 F (36.6 C), temperature source Oral, resp. rate 20. Maternal Exam:  Uterine Assessment: Contraction frequency is regular.   Abdomen: Patient reports no abdominal tenderness. Fundal height is appropriate for gesation.   Estimated fetal weight is 8#.   Fetal presentation: vertex     Physical Exam  Constitutional: She is oriented to person, place, and time. She appears well-developed and well-nourished.  HENT:  Head: Normocephalic and atraumatic.  Eyes: Conjunctivae are normal. Pupils are equal, round, and reactive to light.  Neck: Normal range of motion. Neck supple. No thyromegaly present.  Cardiovascular: Normal rate and regular rhythm.   Respiratory: Effort normal and breath sounds normal. No respiratory distress.  GI: Soft. Bowel sounds are normal. She exhibits no distension.  Musculoskeletal: Normal range of motion.  Neurological: She is alert and oriented to person, place, and time.  Skin: Skin is warm and dry.  Psychiatric: She has a normal mood and affect. Her behavior is normal.    Prenatal labs: ABO, Rh:  B negative Antibody:  neg Rubella:  immune RPR:   NR HBsAg:   neg HIV:   neg GBS:   neg Hgb 11.9/ Plt 397K/ GC neg/ Chl neg/ AFP WNL/ CF neg/ glucola 133/   [place] EDC cwd 8/9 [place], nl anat, nl growth  Rhogam 5/14 TdaP 5/14  Assessment/Plan: 36yo G3P1011 at 39+ with ctx, NRFHT D/w pt VBAC vs rLTCS gbbs neg - no prophylaxis Epidural for  comfort AROM, pitocin to augment prn   BOVARD,Alexsandra Shontz 11/30/2011, 11:27 AM

## 2011-11-30 NOTE — Progress Notes (Signed)
Patient ID: Shelley Long, female   DOB: 12-Jul-1975, 36 y.o.   MRN: [id number] Late entry  Pt with intermittent late decels and variables, IUPC placed, Pt is TOLAC and changing cervix rapidly, was 2 cm now 7.8cm.  Also fever to 101.5, given Amp and Gent.  FHTs 150-160, category II - III toco q 

## 2011-11-30 NOTE — Anesthesia Preprocedure Evaluation (Signed)
Anesthesia Evaluation  Patient identified by MRN, date of birth, ID band Patient awake    Reviewed: Allergy & Precautions, H&P , Patient's Chart, lab work & pertinent test results  Airway Mallampati: II TM Distance: >3 FB Neck ROM: full    Dental No notable dental hx.    Pulmonary neg pulmonary ROS,  breath sounds clear to auscultation  Pulmonary exam normal       Cardiovascular negative cardio ROS  Rhythm:regular Rate:Normal     Neuro/Psych negative neurological ROS  negative psych ROS   GI/Hepatic negative GI ROS, Neg liver ROS,   Endo/Other  negative endocrine ROS  Renal/GU negative Renal ROS     Musculoskeletal   Abdominal   Peds  Hematology negative hematology ROS (+)   Anesthesia Other Findings Hx of ectopic pregnancy     Abdominal pain        Skin moles   abnormal per medical history form dated 11/13/10.  TOLAC  Reproductive/Obstetrics (+) Pregnancy                           Anesthesia Physical Anesthesia Plan  ASA: II  Anesthesia Plan: Epidural   Post-op Pain Management:    Induction:   Airway Management Planned:   Additional Equipment:   Intra-op Plan:   Post-operative Plan:   Informed Consent: I have reviewed the patients History and Physical, chart, labs and discussed the procedure including the risks, benefits and alternatives for the proposed anesthesia with the patient or authorized representative who has indicated his/her understanding and acceptance.     Plan Discussed with:   Anesthesia Plan Comments:         Anesthesia Quick Evaluation

## 2011-11-30 NOTE — MAU Note (Signed)
Ctx's started about 0500, getting a little closer.  No bleeding or leaking. Denies problems with preg, prior c/s for FTP- "long labor, facing the wrong way"

## 2011-12-01 ENCOUNTER — Encounter (HOSPITAL_COMMUNITY): Payer: Self-pay | Admitting: *Deleted

## 2011-12-01 LAB — CBC
HCT: 23.6 % — ABNORMAL LOW (ref 36.0–46.0)
Hemoglobin: 8 g/dL — ABNORMAL LOW (ref 12.0–15.0)
MCH: 28.1 pg (ref 26.0–34.0)
MCHC: 33.9 g/dL (ref 30.0–36.0)
MCV: 82.8 fL (ref 78.0–100.0)
RBC: 2.85 MIL/uL — ABNORMAL LOW (ref 3.87–5.11)

## 2011-12-01 LAB — PREPARE RBC (CROSSMATCH)

## 2011-12-01 MED ORDER — CALCIUM CARBONATE ANTACID 500 MG PO CHEW: 1.0000 | CHEWABLE_TABLET | Freq: Four times a day (QID) | ORAL | Status: DC | PRN

## 2011-12-01 MED ORDER — RHO D IMMUNE GLOBULIN 1500 UNIT/2ML IJ SOLN
300.0000 ug | Freq: Once | INTRAMUSCULAR | Status: AC
  Administered 2011-12-01: 300 ug via INTRAMUSCULAR
  Filled 2011-12-01: qty 2

## 2011-12-01 NOTE — Anesthesia Postprocedure Evaluation (Signed)
  Anesthesia Post-op Note  Patient: Shelley Long  Procedure(s) Performed: * No procedures listed *  Patient Location: PACU and [facility] Unit  Anesthesia Type: Epidural  Level of Consciousness: awake, alert  and oriented  Airway and Oxygen Therapy: Patient Spontanous Breathing  Post-op Pain: none  Post-op Assessment: Post-op Vital signs reviewed  Post-op Vital Signs: Reviewed and stable  Complications: No apparent anesthesia complications

## 2011-12-01 NOTE — Progress Notes (Signed)
Post Partum Day 1 Subjective: up ad lib, voiding, tolerating PO and nl lochia, pain controlled.  bottom uncomfortable.    Objective: Blood pressure 104/78, pulse 94, temperature 98.7 F (37.1 C), temperature source Oral, resp. rate 19, height 5' 1.5" (1.562 m), weight 73.71 kg (162 lb 8 oz), SpO2 98.00%, unknown if currently breastfeeding.  Physical Exam:  General: alert and no distress Lochia: appropriate Uterine Fundus: firm   Basename 12/01/11 0500 11/30/11 0930  HGB 8.0* 11.7*  HCT 23.6* 35.1*    Assessment/Plan: Plan for discharge tomorrow.  Breastfeeding.  Baby in NICU, abx   LOS: 1 day   BOVARD,Deontaye Civello 12/01/2011, 9:46 AM

## 2011-12-02 LAB — RH IG WORKUP (INCLUDES ABO/RH): Fetal Screen: NEGATIVE

## 2011-12-02 MED ORDER — IBUPROFEN 800 MG PO TABS: 800.0000 mg | ORAL_TABLET | Freq: Four times a day (QID) | ORAL | Status: AC

## 2011-12-02 MED ORDER — PRENATAL MULTIVITAMIN CH: 1.0000 | ORAL_TABLET | Freq: Every day | ORAL | Status: DC

## 2011-12-02 MED ORDER — OXYCODONE-ACETAMINOPHEN 5-325 MG PO TABS: 1.0000 | ORAL_TABLET | Freq: Four times a day (QID) | ORAL | Status: AC | PRN

## 2011-12-02 NOTE — Progress Notes (Signed)
Post Partum Day 2 Subjective: no complaints, up ad lib, tolerating PO and nl lochia, pain controlled  Objective: Blood pressure 104/74, pulse 91, temperature 97.4 F (36.3 C), temperature source Oral, resp. rate 18, height 5' 1.5" (1.562 m), weight 73.71 kg (162 lb 8 oz), SpO2 100.00%, unknown if currently breastfeeding.  Physical Exam:  General: alert and no distress Lochia: appropriate Uterine Fundus: firm   Basename 12/01/11 0500 11/30/11 0930  HGB 8.0* 11.7*  HCT 23.6* 35.1*    Assessment/Plan: Discharge home, Breastfeeding and Lactation consult.  Plan for d/c today.  Baby in NICU - RA, Abx.  D/c with motrin/percocet/pnv, f/u 6 weeks   LOS: 2 days   BOVARD,Shatisha Falter 12/02/2011, 8:17 AM

## 2011-12-02 NOTE — Plan of Care (Signed)
Problem: Phase II Progression Outcomes Goal: Incision intact & without signs/symptoms of infection Outcome: Completed/Met Date Met:  12/02/11 Sitz bath demonstrated and performed Goal: Rh isoimmunization per orders Outcome: Completed/Met Date Met:  12/02/11 Rhogam given

## 2011-12-02 NOTE — Discharge Summary (Signed)
Obstetric Discharge Summary Reason for Admission: onset of labor and induction of labor Prenatal Procedures: none Intrapartum Procedures: vacuum Postpartum Procedures: none Complications-Operative and Postpartum: partial 3rd degree perineal laceration Hemoglobin  Date Value Range Status  12/01/2011 8.0* 12.0 - 15.0 g/dL Final     REPEATED TO VERIFY     DELTA CHECK NOTED     HCT  Date Value Range Status  12/01/2011 23.6* 36.0 - 46.0 % Final    Physical Exam:  General: alert and no distress Lochia: appropriate Uterine Fundus: firm Incision: healing well   Discharge Diagnoses: Term Pregnancy-delivered  Discharge Information: Date: 12/02/2011 Activity: pelvic rest Diet: routine Medications: PNV, Ibuprofen, Percocet and recc Colace/ Miralax Condition: stable Instructions: refer to practice specific booklet Discharge to: home Follow-up Information    Follow up with BOVARD,Cheyann Blecha, MD. Schedule an appointment as soon as possible for a visit in 6 weeks.   Contact information:   [address] [facility] [address] [place] [address] [phone]          Newborn Data: Live born female  Birth Weight: 6 lb 4.2 oz (2840 g) APGAR: 5, 7  Baby in NICU - Abx.  BOVARD,Macarius Ruark 12/02/2011, 8:38 AM

## 2011-12-02 NOTE — Progress Notes (Signed)
Discharge instructions reviewed with patient and husband.  Both  Able to " Teach back" home care and signs/symptoms to report to MD.  No home equipment needed.  Ambulated to car with staff without incident and discharged to home with husband.

## 2011-12-03 LAB — TYPE AND SCREEN: Unit division: 0

## 2011-12-11 ENCOUNTER — Inpatient Hospital Stay (HOSPITAL_COMMUNITY): Admission: RE | Admit: 2011-12-11 | Payer: BC Managed Care – PPO | Source: Ambulatory Visit

## 2011-12-21 NOTE — Progress Notes (Signed)
Post-discharge UR chart review completed. 

## 2012-03-10 ENCOUNTER — Encounter (INDEPENDENT_AMBULATORY_CARE_PROVIDER_SITE_OTHER): Payer: Self-pay | Admitting: Surgery

## 2012-03-10 ENCOUNTER — Ambulatory Visit (INDEPENDENT_AMBULATORY_CARE_PROVIDER_SITE_OTHER): Payer: BC Managed Care – PPO | Admitting: Surgery

## 2012-03-10 VITALS — BP 108/60 | HR 60 | Temp 97.6°F | Resp 14 | Ht 62.0 in | Wt 138.0 lb

## 2012-03-10 DIAGNOSIS — M6208 Separation of muscle (nontraumatic), other site: Secondary | ICD-10-CM

## 2012-03-10 DIAGNOSIS — M6200 Separation of muscle (nontraumatic), unspecified site: Secondary | ICD-10-CM

## 2012-03-10 DIAGNOSIS — K439 Ventral hernia without obstruction or gangrene: Secondary | ICD-10-CM

## 2012-03-10 NOTE — Progress Notes (Deleted)
Subjective:     Patient ID: Shelley Long, female   DOB: 06/05/1975, 36 y.o.   MRN: [id number]  HPI  The patient notes a 6 month history of periumbilical pain after eating. She notes swelling there. She was worried that there was a mass there.  Friends and family have noticed as well. She is worried she may have a tumor. She saw Dr. Christella Hartigan. He is concerned hernia. He got a CAT scan which did not show a definite hernia but some laxity periumbilically. He sent the patient to me for consideration of surgery for a hernia.  Patient denies any early satiety. No dysphasia. She had bad heartburn when she was pregnant with her child 2011, she does not feel that this is consistent with heartburn. She does know that spicy foods do bother her. She's not tried any medications for this. She often we'll get some burping and belching.  The pain starts in the abdomen. It then becomes more diffuse  centrally. It comes on quickly and last for several hours. There is no associated constipation or diarrhea. No nausea or vomiting. The pain does not radiate anywhere. It's in the middle. Does not go to the back. Is not associated really with running her activity. She is otherwise quite healthy.  Review of Systems  Constitutional: Negative for fever, chills, diaphoresis, appetite change and fatigue.  HENT: Positive for sore throat. Negative for nosebleeds, mouth sores, neck pain and neck stiffness.        ?Cyclical sore throat - stops after menses  Eyes: Negative for photophobia, discharge and visual disturbance.  Respiratory: Negative for cough, choking, chest tightness and shortness of breath.   Cardiovascular: Negative for chest pain and palpitations.  Gastrointestinal: Positive for abdominal pain and abdominal distention. Negative for nausea, vomiting, diarrhea, constipation, blood in stool, anal bleeding and rectal pain.       No dysphagia.  ?Mild reflux - not as bad as when pregnant.  Mild, taking no meds    Genitourinary: Negative for dysuria, urgency, frequency, hematuria, flank pain, vaginal bleeding, vaginal discharge, difficulty urinating, menstrual problem and pelvic pain.  Musculoskeletal: Negative for back pain, arthralgias and gait problem.  Skin: Negative for color change, pallor and rash.  Neurological: Negative for dizziness, speech difficulty, weakness and numbness.  Hematological: Negative for adenopathy. Does not bruise/bleed easily.  Psychiatric/Behavioral: Negative for confusion and agitation. The patient is not nervous/anxious.        Objective:   Physical Exam  Constitutional: She is oriented to person, place, and time. She appears well-developed and well-nourished. No distress.  HENT:  Head: Normocephalic.  Mouth/Throat: Oropharynx is clear and moist. No oropharyngeal exudate.  Eyes: Conjunctivae normal and EOM are normal. Pupils are equal, round, and reactive to light. No scleral icterus.  Neck: Normal range of motion. Neck supple. No tracheal deviation present.  Cardiovascular: Normal rate, regular rhythm and intact distal pulses.   Pulmonary/Chest: Effort normal and breath sounds normal. No respiratory distress. She exhibits no tenderness.  Abdominal: Soft. Bowel sounds are normal. She exhibits no distension. There is tenderness. There is no rebound and no guarding. Hernia confirmed negative in the right inguinal area and confirmed negative in the left inguinal area.       Periumb diastsis recti persistent while standing.  VWH within this.  Soft/reducible.  Post-partum stretch marks  Genitourinary: No vaginal discharge found.  Musculoskeletal: Normal range of motion. She exhibits no tenderness.  Lymphadenopathy:    She has no cervical adenopathy.  Right: No inguinal adenopathy present.       Left: No inguinal adenopathy present.  Neurological: She is alert and oriented to person, place, and time. No cranial nerve deficit. She exhibits normal muscle tone.  Coordination normal.  Skin: Skin is warm and dry. No rash noted. She is not diaphoretic. No erythema.  Psychiatric: She has a normal mood and affect. Her behavior is normal. Judgment and thought content normal.   CT scan does not show any obstruction. They did not see definite hernia. However I see laxity periumbilically that makes me suspect that she does. Small pancreatic cystic mass. Agree with followup in a year. No other tumors or concerns.    Assessment:     Periumbilical ventral hernia in the setting of diastasis recti.  Abdominal pain postprandial question reflux for rated versus mildly obstructive symptoms.   Plan:     I think she would benefit from earlier.. Is a good candidate for a laparoscopic approach. I may be able to primarily fix the diastases and then do an underlay repair.  I'm concerned that this will not solve all her symptoms. I think it needed to try to do a trial of PPI (omeprazole x3 weeks b.i.d.). If tat resolves all her symptoms, then that would be helpful. However I don't think it will solve all the issues..  With her belching and her postprandial pain I wonder about delayed gastric emptying her upper and reflex, but Dr. Christella Hartigan do not feel that that was issue.   It does not change the fact that she will need earlier. Some point. She will think about things and let me know.

## 2012-03-10 NOTE — Patient Instructions (Addendum)
See the Handout(s) we gave you.  Consider surgery.  Please call our office at [phone] if you wish to schedule surgery or if you have further questions / concerns.   Hernia Repair with Laparoscope A hernia occurs when an internal organ pushes out through a weak spot in the belly (abdominal) wall muscles. Hernias most commonly occur in the groin and around the navel. Hernias can also occur through a cut by the surgeon (incision) after an abdominal operation. A hernia may be caused by:  Lifting heavy objects.  Prolonged coughing.  Straining to move your bowels. Hernias can often be pushed back into place (reduced). Most hernias tend to get worse over time. Problems occur when abdominal contents get stuck in the opening and the blood supply is blocked or impaired (incarcerated hernia). Because of these risks, you require surgery to repair the hernia. Your hernia will be repaired using a laparoscope. Laparoscopic surgery is a type of minimally invasive surgery. It does not involve making a typical surgical cut (incision) in the skin. A laparoscope is a telescope-like rod and lens system. It is usually connected to a video camera and a light source so your caregiver can clearly see the operative area. The instruments are inserted through  to  inch (5 mm or 10 mm) openings in the skin at specific locations. A working and viewing space is created by blowing a small amount of carbon dioxide gas into the abdominal cavity. The abdomen is essentially blown up like a balloon (insufflated). This elevates the abdominal wall above the internal organs like a dome. The carbon dioxide gas is common to the human body and can be absorbed by tissue and removed by the respiratory system. Once the repair is completed, the small incisions will be closed with either stitches (sutures) or staples (just like a paper stapler only this staple holds the skin together). LET YOUR CAREGIVERS KNOW  ABOUT:  Allergies.  Medications taken including herbs, eye drops, over the counter medications, and creams.  Use of steroids (by mouth or creams).  Previous problems with anesthetics or Novocaine.  Possibility of pregnancy, if this applies.  History of blood clots (thrombophlebitis).  History of bleeding or blood problems.  Previous surgery.  Other health problems. BEFORE THE PROCEDURE  Laparoscopy can be done either in a hospital or out-patient clinic. You may be given a mild sedative to help you relax before the procedure. Once in the operating room, you will be given a general anesthesia to make you sleep (unless you and your caregiver choose a different anesthetic).  AFTER THE PROCEDURE  After the procedure you will be watched in a recovery area. Depending on what type of hernia was repaired, you might be admitted to the hospital or you might go home the same day. With this procedure you may have less pain and scarring. This usually results in a quicker recovery and less risk of infection. HOME CARE INSTRUCTIONS   Bed rest is not required. You may continue your normal activities but avoid heavy lifting (more than 10 pounds) or straining.  Cough gently. If you are a smoker it is best to stop, as even the best hernia repair can break down with the continual strain of coughing.  Avoid driving until given the OK by your surgeon.  There are no dietary restrictions unless given otherwise.  TAKE ALL MEDICATIONS AS DIRECTED.  Only take over-the-counter or prescription medicines for pain, discomfort, or fever as directed by your caregiver. SEEK MEDICAL CARE   IF:   There is increasing abdominal pain or pain in your incisions.  There is more bleeding from incisions, other than minimal spotting.  You feel light headed or faint.  You develop an unexplained fever, chills, and/or an oral temperature above 102 F (38.9 C).  You have redness, swelling, or increasing pain in the  wound.  Pus coming from wound.  A foul smell coming from the wound or dressings. SEEK IMMEDIATE MEDICAL CARE IF:   You develop a rash.  You have difficulty breathing.  You have any allergic problems. MAKE SURE YOU:   Understand these instructions.  Will watch your condition.  Will get help right away if you are not doing well or get worse. Document Released: 04/16/2005 Document Revised: 07/09/2011 Document Reviewed: 03/16/2009 Exit[employer] Patient Information 2013 [employer].  

## 2012-03-10 NOTE — Progress Notes (Signed)
Subjective:     Patient ID: Shelley Long, female   DOB: [birth year], 36 y.o.   MRN: [id number]  HPI  Shelley Long  1975-11-03 [id number]  Patient Care Team: Kendrick Ranch, MD as PCP - General (Internal Medicine) Rachael Fee, MD as Consulting Physician (Gastroenterology) Kendrick Ranch, MD as Attending Physician (Internal Medicine) Lavina Hamman, MD as Consulting Physician (Obstetrics and Gynecology)  This patient is a 36 y.o.female who presents today for surgical evaluation.   Reason for visit: Reconsider hernia repair.  Pleasant female.  I saw her last year.  There was concern of a probable periumbilical hernia in the setting of diastases recti.  I offered diagnostic laparoscopy with probable hernia repair.  She became pregnant and delayed surgery.  She is now three months out from that.  She notes during her second pregnancy, she had a much more obvious bulge.  It persists now.  Having daily bowel movements.  No constipation.  Rather active.  Breast-feeding baby.  Comes by herself today.  Patient Active Problem List  Diagnosis  . Hernia, ventral  . Diastasis recti  . Breast pain  . Abdominal cramping  . Pancreatic cyst  . Anemia  . Family history of breast cancer in first degree relative  . Normal pregnancy  . Delivery by vacuum extractor affecting fetus or newborn    Past Medical History  Diagnosis Date  . Hx of ectopic pregnancy   . Abdominal pain   . Skin moles     abnormal per medical history form dated 11/13/10.  . Normal pregnancy 11/30/2011  . Delivery by vacuum extractor affecting fetus or newborn 11/30/2011    Past Surgical History  Procedure Date  . Cesarean section     x 1   . Ectopic pregnancy surgery 2010    History   Social History  . Marital Status: Married    Spouse Name: N/A    Number of Children: N/A  . Years of Education: N/A   Occupational History  . CDW     Social History Main Topics  . Smoking status: Never Smoker   .  Smokeless tobacco: Never Used  . Alcohol Use: Yes     Comment: social 1/2 drink per week; none with preg  . Drug Use: No  . Sexually Active: Yes -- Female partner(s)   Other Topics Concern  . Not on file   Social History Narrative   0 Caffeine drinks     Family History  Problem Relation Age of Onset  . Colon cancer Neg Hx   . Other Neg Hx   . Cancer Mother     breast - stage 0  . Cancer Maternal Aunt     breast cancer  . Cancer Maternal Aunt     ovarian  . Cancer Maternal Grandmother     breast or uterine    Current Outpatient Prescriptions  Medication Sig Dispense Refill  . calcium carbonate (TUMS - DOSED IN MG ELEMENTAL CALCIUM) 500 MG chewable tablet Chew 1 tablet by mouth daily. For heartburn.      . Prenatal Vit-Fe Fumarate-FA (PRENATAL MULTIVITAMIN) TABS Take 1 tablet by mouth daily.         No Known Allergies  BP 108/60  Pulse 60  Temp 97.6 F (36.4 C) (Temporal)  Resp 14  Ht 5\' 2"  (1.575 m)  Wt 138 lb (62.596 kg)  BMI 25.24 kg/m2  No results found.   Review of Systems  Constitutional: Negative for fever, chills,  diaphoresis, appetite change and fatigue.  HENT: Negative for ear pain, sore throat, trouble swallowing, neck pain and ear discharge.   Eyes: Negative for photophobia, discharge and visual disturbance.  Respiratory: Negative for cough, choking, chest tightness and shortness of breath.   Cardiovascular: Negative for chest pain and palpitations.  Gastrointestinal: Positive for abdominal distention. Negative for nausea, vomiting, abdominal pain, diarrhea, constipation, anal bleeding and rectal pain.  Genitourinary: Negative for dysuria, frequency and difficulty urinating.  Musculoskeletal: Negative for myalgias and gait problem.  Skin: Negative for color change, pallor and rash.  Neurological: Negative for dizziness, speech difficulty, weakness and numbness.  Hematological: Negative for adenopathy.  Psychiatric/Behavioral: Negative for confusion  and agitation. The patient is not nervous/anxious.        Objective:   Physical Exam  Constitutional: She is oriented to person, place, and time. She appears well-developed and well-nourished. No distress.  HENT:  Head: Normocephalic.  Mouth/Throat: Oropharynx is clear and moist. No oropharyngeal exudate.  Eyes: Conjunctivae normal and EOM are normal. Pupils are equal, round, and reactive to light. No scleral icterus.  Neck: Normal range of motion. Neck supple. No tracheal deviation present.  Cardiovascular: Normal rate, regular rhythm and intact distal pulses.   Pulmonary/Chest: Effort normal and breath sounds normal. No respiratory distress. She exhibits no tenderness.  Abdominal: Soft. She exhibits no distension, no pulsatile liver and no mass. There is no CVA tenderness. A hernia is present. Hernia confirmed positive in the ventral area. Hernia confirmed negative in the right inguinal area and confirmed negative in the left inguinal area.    Genitourinary: No vaginal discharge found.  Musculoskeletal: Normal range of motion. She exhibits no tenderness.  Lymphadenopathy:    She has no cervical adenopathy.       Right: No inguinal adenopathy present.       Left: No inguinal adenopathy present.  Neurological: She is alert and oriented to person, place, and time. No cranial nerve deficit. She exhibits normal muscle tone. Coordination normal.  Skin: Skin is warm and dry. No rash noted. She is not diaphoretic. No erythema.  Psychiatric: She has a normal mood and affect. Her speech is normal and behavior is normal. Judgment and thought content normal.       Very inquisitive & Chatty.  Pleasant       Assessment:     Obvious periumbilical ventral wall hernia in the setting of diastases recti    Plan:     I think she would benefit from hernia repair.  I think a laparoscopic underlay with mesh would be helpful to make sure there are not multiple locations and allow repair underneath the  diastases recti.    The anatomy & physiology of the abdominal wall was discussed.  The pathophysiology of hernias was discussed.  Natural history risks without surgery including progeressive enlargement, pain, incarceration & strangulation was discussed.   Contributors to complications such as smoking, obesity, diabetes, prior surgery, etc were discussed.   I feel the risks of no intervention will lead to serious problems that outweigh the operative risks; therefore, I recommended surgery to reduce and repair the hernia.  I explained laparoscopic techniques with possible need for an open approach.  I noted the probable use of mesh to patch and/or buttress the hernia repair  Risks such as bleeding, infection, abscess, need for further treatment, heart attack, death, and other risks were discussed.  I noted a good likelihood this will help address the problem.   Goals of post-operative  recovery were discussed as well.  Possibility that this will not correct all symptoms was explained.  I stressed the importance of low-impact activity, aggressive pain control, avoiding constipation, & not pushing through pain to minimize risk of post-operative chronic pain or injury. Possibility of reherniation especially with smoking, obesity, diabetes, immunosuppression, and other health conditions was discussed.  We will work to minimize complications.     An educational handout further explaining the pathology & treatment options was given as well.  Numerous questions were answered.  The patient expresses understanding & wishes to proceed with surgery.  Initially she wondered if this could be coordinated with a tubal ligation with Dr. Jarold Song.  I noted that it may delay things to coordinated, but it is certainly feasible to do a combined case under the same general anesthetic.  She seemed more decided just to proceed with the hernia repair first  by the end of the visit.  I spent over a half hour on her case, much of  that discussing things.

## 2012-03-13 ENCOUNTER — Telehealth (INDEPENDENT_AMBULATORY_CARE_PROVIDER_SITE_OTHER): Payer: Self-pay | Admitting: General Surgery

## 2012-03-13 NOTE — Telephone Encounter (Signed)
Pt called to ask if she should go on a cruise April 06, 2012? Her Ventral hernia surgery is scheduled for April 02, 2012. I advised her not to go on cruise due to  close proximity to surgery date, and chance of any complications occuring so soon after surgery, plus she would be uncomfortable and may still be having pain medication issues.Lanier Prude

## 2012-04-02 ENCOUNTER — Telehealth (INDEPENDENT_AMBULATORY_CARE_PROVIDER_SITE_OTHER): Payer: Self-pay | Admitting: General Surgery

## 2012-04-02 DIAGNOSIS — K429 Umbilical hernia without obstruction or gangrene: Secondary | ICD-10-CM

## 2012-04-02 HISTORY — PX: HERNIA REPAIR: SHX51

## 2012-04-02 HISTORY — PX: VENTRAL HERNIA REPAIR: SHX424

## 2012-04-02 NOTE — Telephone Encounter (Signed)
Called and left message for patient to advise of post op appointment. Requested patient call back to obtain info. Patient scheduled to see Dr. Michaell Cowing on 04/14/12 at 10:00.

## 2012-04-03 ENCOUNTER — Telehealth (INDEPENDENT_AMBULATORY_CARE_PROVIDER_SITE_OTHER): Payer: Self-pay | Admitting: General Surgery

## 2012-04-03 ENCOUNTER — Telehealth (INDEPENDENT_AMBULATORY_CARE_PROVIDER_SITE_OTHER): Payer: Self-pay

## 2012-04-03 NOTE — Telephone Encounter (Signed)
Left message on machine for patient to call back. Made her aware to get a quicker response to her questions to ask for nursing triage when she calls back.

## 2012-04-03 NOTE — Telephone Encounter (Signed)
Message copied by Liliana Cline on Thu Apr 03, 2012  2:01 PM ------      Message from: Isaias Sakai K      Created: Thu Apr 03, 2012 12:18 PM      Regarding: Dr Leanne Chang: [phone]       Have questions about aftercare of sx on yesterday

## 2012-04-03 NOTE — Telephone Encounter (Signed)
The pt called in with questions.  She was given an appointment for 12/16 but was told to see Dr Michaell Cowing next week Monday or Tuesday.  She has a drain.  I told her the pt who scheduled it may not have known she had one.  I moved her up to an opening on 12/10.  She knows how to empty the drain and has a sheet to record the output.  She asked if she should sleep on her back or side.  I told her either back or side without a drain, whichever is more comfortable.  She asked when she can shower. At 1st I had told her 48 hrs postop and remove the bandage.  Then I learned she has a drain and said I will need to see if Dr Michaell Cowing wants her to wait to shower.  We will call her back

## 2012-04-04 NOTE — Telephone Encounter (Signed)
She has waterproof dressings, so it is OK to shower over everything, including the drain (as I had explained to she & her husband pre & post-op).  No baths until the drain is out

## 2012-04-04 NOTE — Telephone Encounter (Signed)
I called and let the pt know what Dr Michaell Cowing said about showering.  She then asked me about how to find out the name of a medicine she was given in recovery.  She had a bad reaction.  I told her to contact the hospital and speak to medical records to get copies of her records.

## 2012-04-08 ENCOUNTER — Encounter (INDEPENDENT_AMBULATORY_CARE_PROVIDER_SITE_OTHER): Payer: Self-pay | Admitting: Surgery

## 2012-04-08 ENCOUNTER — Ambulatory Visit (INDEPENDENT_AMBULATORY_CARE_PROVIDER_SITE_OTHER): Payer: BC Managed Care – PPO | Admitting: Surgery

## 2012-04-08 VITALS — BP 110/62 | HR 80 | Resp 16 | Ht 62.0 in | Wt 137.0 lb

## 2012-04-08 DIAGNOSIS — K439 Ventral hernia without obstruction or gangrene: Secondary | ICD-10-CM

## 2012-04-08 DIAGNOSIS — M6200 Separation of muscle (nontraumatic), unspecified site: Secondary | ICD-10-CM

## 2012-04-08 DIAGNOSIS — M6208 Separation of muscle (nontraumatic), other site: Secondary | ICD-10-CM

## 2012-04-08 NOTE — Patient Instructions (Addendum)
HERNIA REPAIR: POST OP INSTRUCTIONS  1. DIET: Follow a light bland diet the first 24 hours after arrival home, such as soup, liquids, crackers, etc.  Be sure to include lots of fluids daily.  Avoid fast food or heavy meals as your are more likely to get nauseated.  Eat a low fat the next few days after surgery. 2. Take your usually prescribed home medications unless otherwise directed. 3. PAIN CONTROL: a. Pain is best controlled by a usual combination of three different methods TOGETHER: i. Ice/Heat ii. Over the counter pain medication iii. Prescription pain medication b. Most patients will experience some swelling and bruising around the hernia(s) such as the bellybutton, groins, or old incisions.  Ice packs or heating pads (30-60 minutes up to 6 times a day) will help. Use ice for the first few days to help decrease swelling and bruising, then switch to heat to help relax tight/sore spots and speed recovery.  Some people prefer to use ice alone, heat alone, alternating between ice & heat.  Experiment to what works for you.  Swelling and bruising can take several weeks to resolve.   c. It is helpful to take an over-the-counter pain medication regularly for the first few weeks.  Choose one of the following that works best for you: i. Naproxen (Aleve, etc)  Two 220mg tabs twice a day ii. Ibuprofen (Advil, etc) Three 200mg tabs four times a day (every meal & bedtime) iii. Acetaminophen (Tylenol, etc) 325-650mg four times a day (every meal & bedtime) d. A  prescription for pain medication should be given to you upon discharge.  Take your pain medication as prescribed.  i. If you are having problems/concerns with the prescription medicine (does not control pain, nausea, vomiting, rash, itching, etc), please call us [phone] to see if we need to switch you to a different pain medicine that will work better for you and/or control your side effect better. ii. If you need a refill on your pain  medication, please contact your pharmacy.  They will contact our office to request authorization. Prescriptions will not be filled after 5 pm or on week-ends. 4. Avoid getting constipated.  Between the surgery and the pain medications, it is common to experience some constipation.  Increasing fluid intake and taking a fiber supplement (such as Metamucil, Citrucel, FiberCon, MiraLax, etc) 1-2 times a day regularly will usually help prevent this problem from occurring.  A mild laxative (prune juice, Milk of Magnesia, MiraLax, etc) should be taken according to package directions if there are no bowel movements after 48 hours.   5. Wash / shower every day.  You may shower over the dressings as they are waterproof.   6. Remove your waterproof bandages 5 days after surgery.  You may leave the incision open to air.  You may replace a dressing/Band-Aid to cover the incision for comfort if you wish.  Continue to shower over incision(s) after the dressing is off.    7. ACTIVITIES as tolerated:   a. You may resume regular (light) daily activities beginning the next day-such as daily self-care, walking, climbing stairs-gradually increasing activities as tolerated.  If you can walk 30 minutes without difficulty, it is safe to try more intense activity such as jogging, treadmill, bicycling, low-impact aerobics, swimming, etc. b. Save the most intensive and strenuous activity for last such as sit-ups, heavy lifting, contact sports, etc  Refrain from any heavy lifting or straining until you are off narcotics for pain control.     c. DO NOT PUSH THROUGH PAIN.  Let pain be your guide: If it hurts to do something, don't do it.  Pain is your body warning you to avoid that activity for another week until the pain goes down. d. You may drive when you are no longer taking prescription pain medication, you can comfortably wear a seatbelt, and you can safely maneuver your car and apply brakes. e. You may have sexual intercourse  when it is comfortable.  8. FOLLOW UP in our office a. Please call [facility] at [phone] to set up an appointment to see your surgeon in the office for a follow-up appointment approximately 2-3 weeks after your surgery. b. Make sure that you call for this appointment the day you arrive home to insure a convenient appointment time. 9.  IF YOU HAVE DISABILITY OR FAMILY LEAVE FORMS, BRING THEM TO THE OFFICE FOR PROCESSING.  DO NOT GIVE THEM TO YOUR DOCTOR.  WHEN TO CALL US [phone]: 1. Poor pain control 2. Reactions / problems with new medications (rash/itching, nausea, etc)  3. Fever over 101.5 F (38.5 C) 4. Inability to urinate 5. Nausea and/or vomiting 6. Worsening swelling or bruising 7. Continued bleeding from incision. 8. Increased pain, redness, or drainage from the incision   The clinic staff is available to answer your questions during regular business hours (8:30am-5pm).  Please don't hesitate to call and ask to speak to one of our nurses for clinical concerns.   If you have a medical emergency, go to the nearest emergency room or call 911.  A surgeon from [facility] is always on call at the hospitals in [place]  [facility] [address], [address], [place], [place]  [address] ?  [address] [address], [place], [place]   [address] MAIN: [phone] ? TOLL FREE: [phone] ? FAX: [phone] [email or web address]  Managing Pain  Pain after surgery or related to activity is often due to strain/injury to muscle, tendon, nerves and/or incisions.  This pain is usually short-term and will improve in a few months.   Many people find it helpful to do the following things TOGETHER to help speed the process of healing and to get back to regular activity more quickly:  1. Avoid heavy physical activity a.  no lifting greater than 20 pounds b. Do not "push through" the pain.  Listen to your body and avoid positions and maneuvers than  reproduce the pain c. Walking is okay as tolerated, but go slowly and stop when getting sore.  d. Remember: If it hurts to do it, then don't do it! 2. Take Anti-inflammatory medication  a. Take with food/snack around the clock for 1-2 weeks i. This helps the muscle and nerve tissues become less irritable and calm down faster b. Choose ONE of the following over-the-counter medications: i. Naproxen 220mg tabs (ex. Aleve) 1-2 pills twice a day  ii. Ibuprofen 200mg tabs (ex. Advil, Motrin) 3-4 pills with every meal and just before bedtime iii. Acetaminophen 500mg tabs (Tylenol) 1-2 pills with every meal and just before bedtime 3. Use a Heating pad or Ice/Cold Pack a. 4-6 times a day b. May use warm bath/hottub  or showers 4. Try Gentle Massage and/or Stretching  a. at the area of pain many times a day b. stop if you feel pain - do not overdo it  Try these steps together to help you body heal faster and avoid making things get worse.  Doing just one of these   things may not be enough.    If you are not getting better after two weeks or are noticing you are getting worse, contact our office for further advice; we may need to re-evaluate you & see what other things we can do to help.   

## 2012-04-08 NOTE — Progress Notes (Signed)
Subjective:     Patient ID: Shelley Long, female   DOB: 01/14/1976, 36 y.o.   MRN: [id number]  HPI  Shelley Long  [birth year] [id number]  Patient Care Team: Kendrick Ranch, MD as PCP - General (Internal Medicine) Rachael Fee, MD as Consulting Physician (Gastroenterology) Lavina Hamman, MD as Consulting Physician (Obstetrics and Gynecology)  This patient is a 36 y.o.female who presents today for surgical evaluation Status post repair of diastases recti as well as reduction and repair of incarcerated periumbilical ventral hernia..   The patient comes today with her husband.  She is now six days after surgery.  Moderately sore, but progressing.  Stopped narcotics on POD#4.  Trying to use ibuprofen once or twice a day.  Walking 15 min walks four times a day.  Moving her bowels.  Healing well.  Still rather sore.  Binder helps.  Drain output has been 5-10 mL a day for the past few days.  Patient Active Problem List  Diagnosis  . Hernia, ventral, s/p lap VWH repair w mesh  . Diastasis recti s/p primary closure/repair  . Breast pain  . Abdominal cramping  . Pancreatic cyst  . Anemia  . Family history of breast cancer in first degree relative  . Normal pregnancy  . Delivery by vacuum extractor affecting fetus or newborn    Past Medical History  Diagnosis Date  . Hx of ectopic pregnancy   . Abdominal pain   . Skin moles     abnormal per medical history form dated 11/13/10.  . Normal pregnancy 11/30/2011  . Delivery by vacuum extractor affecting fetus or newborn 11/30/2011    Past Surgical History  Procedure Date  . Cesarean section     x 1   . Ectopic pregnancy surgery 2010  . Ventral hernia repair 04/02/2012    History   Social History  . Marital Status: Married    Spouse Name: N/A    Number of Children: N/A  . Years of Education: N/A   Occupational History  . CDW     Social History Main Topics  . Smoking status: Never Smoker   . Smokeless tobacco: Never Used  .  Alcohol Use: Yes     Comment: social 1/2 drink per week; none with preg  . Drug Use: No  . Sexually Active: Yes -- Female partner(s)   Other Topics Concern  . Not on file   Social History Narrative   0 Caffeine drinks     Family History  Problem Relation Age of Onset  . Colon cancer Neg Hx   . Other Neg Hx   . Cancer Mother     breast - stage 0  . Cancer Maternal Aunt     breast cancer  . Cancer Maternal Aunt     ovarian  . Cancer Maternal Grandmother     breast or uterine    Current Outpatient Prescriptions  Medication Sig Dispense Refill  . Prenatal Vit-Fe Fumarate-FA (PRENATAL MULTIVITAMIN) TABS Take 1 tablet by mouth daily.         No Known Allergies  BP 110/62  Pulse 80  Resp 16  Ht 5\' 2"  (1.575 m)  Wt 137 lb (62.143 kg)  BMI 25.06 kg/m2  No results found.   Review of Systems  Constitutional: Negative for fever, chills and diaphoresis.  HENT: Negative for ear pain, sore throat and trouble swallowing.   Eyes: Negative for photophobia and visual disturbance.  Respiratory: Negative for cough and choking.  Cardiovascular: Negative for chest pain and palpitations.  Gastrointestinal: Positive for abdominal pain. Negative for nausea, vomiting, diarrhea, constipation, blood in stool, anal bleeding and rectal pain.  Genitourinary: Negative for dysuria, frequency and difficulty urinating.  Musculoskeletal: Negative for myalgias and gait problem.  Skin: Negative for color change, pallor and rash.  Neurological: Negative for dizziness, speech difficulty, weakness and numbness.  Hematological: Negative for adenopathy.  Psychiatric/Behavioral: Negative for confusion and agitation. The patient is not nervous/anxious.        Objective:   Physical Exam  Constitutional: She is oriented to person, place, and time. She appears well-developed and well-nourished. No distress.  HENT:  Head: Normocephalic.  Mouth/Throat: Oropharynx is clear and moist. No oropharyngeal  exudate.  Eyes: Conjunctivae normal and EOM are normal. Pupils are equal, round, and reactive to light. No scleral icterus.  Neck: Normal range of motion. No tracheal deviation present.  Cardiovascular: Normal rate and intact distal pulses.   Pulmonary/Chest: Effort normal. No respiratory distress. She exhibits no tenderness.  Abdominal: Soft. She exhibits no distension and no mass. There is no tenderness. There is no CVA tenderness. No hernia. Hernia confirmed negative in the right inguinal area and confirmed negative in the left inguinal area.       Incisions clean with normal healing ridges.  No hernias.  Redundant skin more contracted.   Drain 5-10mL a day output - removed  Genitourinary: No vaginal discharge found.  Musculoskeletal: Normal range of motion. She exhibits no tenderness.  Lymphadenopathy:       Right: No inguinal adenopathy present.       Left: No inguinal adenopathy present.  Neurological: She is alert and oriented to person, place, and time. No cranial nerve deficit. She exhibits normal muscle tone. Coordination normal.  Skin: Skin is warm and dry. No rash noted. She is not diaphoretic.  Psychiatric: She has a normal mood and affect. Her behavior is normal.       Assessment:     S/p lap VWH repair, recovering well    Plan:     Increase activity as tolerated to regular activity.  Do not push through pain.   Improve pain control w ibuprofen QID & heat QID.  Use oxycodone as needed.  New binder as tolerated.  Consider restarting narcotics to sleep better and continued to improve.  Most people need narcotics for several weeks after the surgery.OK to try short drives but not to [facility]  Diet as tolerated. Bowel regimen to avoid problems.  Return to clinic 3-4 weeks.   Instructions discussed.  Followup with primary care physician for other health issues as would normally be done.  Questions answered.  The patient expressed understanding and appreciation

## 2012-04-14 ENCOUNTER — Encounter (INDEPENDENT_AMBULATORY_CARE_PROVIDER_SITE_OTHER): Payer: BC Managed Care – PPO | Admitting: Surgery

## 2012-05-07 ENCOUNTER — Encounter (INDEPENDENT_AMBULATORY_CARE_PROVIDER_SITE_OTHER): Payer: Self-pay | Admitting: Surgery

## 2012-05-07 ENCOUNTER — Ambulatory Visit (INDEPENDENT_AMBULATORY_CARE_PROVIDER_SITE_OTHER): Payer: BC Managed Care – PPO | Admitting: Surgery

## 2012-05-07 VITALS — BP 96/68 | HR 79 | Temp 97.2°F | Resp 16 | Ht 61.75 in | Wt 134.8 lb

## 2012-05-07 DIAGNOSIS — M6200 Separation of muscle (nontraumatic), unspecified site: Secondary | ICD-10-CM

## 2012-05-07 DIAGNOSIS — M6208 Separation of muscle (nontraumatic), other site: Secondary | ICD-10-CM

## 2012-05-07 DIAGNOSIS — K439 Ventral hernia without obstruction or gangrene: Secondary | ICD-10-CM

## 2012-05-07 NOTE — Progress Notes (Signed)
Subjective:     Patient ID: Shelley Long, female   DOB: February 13, 1976, 37 y.o.   MRN: [id number]  HPI   Shelley Long  1975-05-10 [id number]  Patient Care Team: Kendrick Ranch, MD as PCP - General (Internal Medicine) Rachael Fee, MD as Consulting Physician (Gastroenterology) Lavina Hamman, MD as Consulting Physician (Obstetrics and Gynecology)  This patient is a 37 y.o.female who presents today for surgical evaluation Status post repair of diastases recti as well as reduction and repair of incarcerated periumbilical ventral hernia..   The patient comes in today by herself.  Has been gradually doing well.  Weaned off pain meds.  Then developed a sharp strain in the left upper quadrant a few days ago.  Does not recall any fall or trauma.  Ibuprofen helps.  Try to avoid narcotics.  Back to driving to [place].  He occasionally takes a narcotic after driving there.  No fevers or chills.  No nausea or vomiting.  Was worried a little better her bellybutton but no drainage.  Energy level better.  To exercise more perforated do so at this time.  Patient Active Problem List  Diagnosis  . Hernia, ventral, s/p lap VWH repair w mesh  . Diastasis recti s/p primary closure/repair  . Breast pain  . Abdominal cramping  . Pancreatic cyst  . Anemia  . Family history of breast cancer in first degree relative  . Normal pregnancy  . Delivery by vacuum extractor affecting fetus or newborn    Past Medical History  Diagnosis Date  . Hx of ectopic pregnancy   . Abdominal pain   . Skin moles     abnormal per medical history form dated 11/13/10.  . Normal pregnancy 11/30/2011  . Delivery by vacuum extractor affecting fetus or newborn 11/30/2011    Past Surgical History  Procedure Date  . Cesarean section     x 1   . Ectopic pregnancy surgery 2010  . Ventral hernia repair 04/02/2012  . Hernia repair 04/02/12    lap ventral hernia repair    History   Social History  . Marital Status: Married    Spouse Name: N/A    Number of Children: N/A  . Years of Education: N/A   Occupational History  . CDW     Social History Main Topics  . Smoking status: Never Smoker   . Smokeless tobacco: Never Used  . Alcohol Use: Yes     Comment: social 1/2 drink per week; none with preg  . Drug Use: No  . Sexually Active: Yes -- Female partner(s)   Other Topics Concern  . Not on file   Social History Narrative   0 Caffeine drinks     Family History  Problem Relation Age of Onset  . Colon cancer Neg Hx   . Other Neg Hx   . Cancer Mother     breast - stage 0  . Cancer Maternal Aunt     breast cancer  . Cancer Maternal Aunt     ovarian  . Cancer Maternal Grandmother     breast or uterine    Current Outpatient Prescriptions  Medication Sig Dispense Refill  . Prenatal Vit-Fe Fumarate-FA (PRENATAL MULTIVITAMIN) TABS Take 1 tablet by mouth daily.         No Known Allergies  BP 96/68  Pulse 79  Temp 97.2 F (36.2 C) (Temporal)  Resp 16  Ht 5' 1.75" (1.568 m)  Wt 134 lb 12.8 oz (61.145 kg)  BMI 24.86 kg/m2  SpO2 99%  No results found.   Review of Systems  Constitutional: Negative for fever, chills and diaphoresis.  HENT: Negative for ear pain, sore throat and trouble swallowing.   Eyes: Negative for photophobia and visual disturbance.  Respiratory: Negative for cough and choking.   Cardiovascular: Negative for chest pain and palpitations.  Gastrointestinal: Positive for abdominal pain. Negative for nausea, vomiting, diarrhea, constipation, blood in stool, anal bleeding and rectal pain.  Genitourinary: Negative for dysuria, frequency and difficulty urinating.  Musculoskeletal: Negative for myalgias and gait problem.  Skin: Negative for color change, pallor and rash.  Neurological: Negative for dizziness, speech difficulty, weakness and numbness.  Hematological: Negative for adenopathy.  Psychiatric/Behavioral: Negative for confusion and agitation. The patient is not  nervous/anxious.        Objective:   Physical Exam  Constitutional: She is oriented to person, place, and time. She appears well-developed and well-nourished. No distress.  HENT:  Head: Normocephalic.  Mouth/Throat: Oropharynx is clear and moist. No oropharyngeal exudate.  Eyes: Conjunctivae normal and EOM are normal. Pupils are equal, round, and reactive to light. No scleral icterus.  Neck: Normal range of motion. No tracheal deviation present.  Cardiovascular: Normal rate and intact distal pulses.   Pulmonary/Chest: Effort normal. No respiratory distress. She exhibits no tenderness.  Abdominal: Soft. She exhibits no distension, no pulsatile liver, no fluid wave and no mass. There is no splenomegaly or hepatomegaly. There is tenderness in the left upper quadrant. There is no rigidity, no guarding, no CVA tenderness, no tenderness at McBurney's point and negative Murphy's sign. No hernia. Hernia confirmed negative in the ventral area, confirmed negative in the right inguinal area and confirmed negative in the left inguinal area.         Incisions clean with normal healing ridges.  No hernias.  Redundant skin more contracted.   Genitourinary: No vaginal discharge found.  Musculoskeletal: Normal range of motion. She exhibits no tenderness.  Lymphadenopathy:       Right: No inguinal adenopathy present.       Left: No inguinal adenopathy present.  Neurological: She is alert and oriented to person, place, and time. No cranial nerve deficit. She exhibits normal muscle tone. Coordination normal.  Skin: Skin is warm and dry. No rash noted. She is not diaphoretic.  Psychiatric: She has a normal mood and affect. Her behavior is normal.       Assessment:     S/p lap VWH repair, Recovered mostly well but with recent musculoskeletal strain.  No evidence of hernia recurrence.    Plan:     Increase activity as tolerated to regular activity.  Do not push through pain. I think her left upper  quadrant pain as a temporary strain that should get better with time.  Improve pain control w ibuprofen QID & heat QID.  I would do this around the clock for at least two weeks for better anti-inflammatory control.  Use oxycodone as needed.  New binder as tolerated.  Consider restarting narcotics to sleep better and continue to improve.  Most people need narcotics for several weeks after the surgery.  Diet as tolerated. Bowel regimen to avoid problems.  Return to clinic As needed.  If things worsen, may need to reevaluate.  However, I am hopeful that these pains are short-term annoyances that will gradually fade away.  She feels reassured.   Instructions discussed.  Followup with primary care physician for other health issues as would normally be done.  Questions answered.  The patient expressed understanding and appreciation

## 2012-05-07 NOTE — Patient Instructions (Signed)

## 2013-01-13 ENCOUNTER — Ambulatory Visit (INDEPENDENT_AMBULATORY_CARE_PROVIDER_SITE_OTHER): Payer: BC Managed Care – PPO | Admitting: Internal Medicine

## 2013-01-13 ENCOUNTER — Encounter: Payer: Self-pay | Admitting: Internal Medicine

## 2013-01-13 VITALS — BP 93/61 | HR 74 | Temp 97.4°F | Resp 18 | Wt 121.0 lb

## 2013-01-13 DIAGNOSIS — D649 Anemia, unspecified: Secondary | ICD-10-CM

## 2013-01-13 LAB — COMPREHENSIVE METABOLIC PANEL
ALT: 13 U/L (ref 0–35)
CO2: 28 mEq/L (ref 19–32)
Calcium: 9.8 mg/dL (ref 8.4–10.5)
Chloride: 104 mEq/L (ref 96–112)
Creat: 0.75 mg/dL (ref 0.50–1.10)
Glucose, Bld: 85 mg/dL (ref 70–99)
Total Protein: 7.4 g/dL (ref 6.0–8.3)

## 2013-01-13 LAB — CBC WITH DIFFERENTIAL/PLATELET
Eosinophils Relative: 2 % (ref 0–5)
HCT: 36.1 % (ref 36.0–46.0)
Hemoglobin: 12.3 g/dL (ref 12.0–15.0)
Lymphocytes Relative: 31 % (ref 12–46)
Lymphs Abs: 1.7 10*3/uL (ref 0.7–4.0)
MCV: 79.9 fL (ref 78.0–100.0)
Monocytes Relative: 8 % (ref 3–12)
Platelets: 409 10*3/uL — ABNORMAL HIGH (ref 150–400)
RBC: 4.52 MIL/uL (ref 3.87–5.11)
WBC: 5.4 10*3/uL (ref 4.0–10.5)

## 2013-01-13 LAB — LIPID PANEL
Cholesterol: 204 mg/dL — ABNORMAL HIGH (ref 0–200)
Total CHOL/HDL Ratio: 3.8 Ratio

## 2013-01-13 NOTE — Patient Instructions (Addendum)
See me in January   

## 2013-01-13 NOTE — Progress Notes (Signed)
  Subjective:    Patient ID: Shelley Long, female    DOB: [birth year], 37 y.o.   MRN: [id number]  HPI  Shelley Long is here for follow up   I have not seen her in 2 years  Since our last visit.  She has had diastatsis repair by Dr. Michaell Cowing  Doing well except fatigue  Has 2 children under the age of 5 at home  No Known Allergies Past Medical History  Diagnosis Date  . Hx of ectopic pregnancy   . Abdominal pain   . Skin moles     abnormal per medical history form dated 11/13/10.  . Normal pregnancy 11/30/2011  . Delivery by vacuum extractor affecting fetus or newborn 11/30/2011   Past Surgical History  Procedure Laterality Date  . Cesarean section      x 1   . Ectopic pregnancy surgery  2010  . Ventral hernia repair  04/02/2012  . Hernia repair  04/02/12    lap ventral hernia repair   History   Social History  . Marital Status: Married    Spouse Name: N/A    Number of Children: N/A  . Years of Education: N/A   Occupational History  . CDW     Social History Main Topics  . Smoking status: Never Smoker   . Smokeless tobacco: Never Used  . Alcohol Use: Yes     Comment: social 1/2 drink per week; none with preg  . Drug Use: No  . Sexual Activity: Yes    Partners: Male   Other Topics Concern  . Not on file   Social History Narrative   0 Caffeine drinks    Family History  Problem Relation Age of Onset  . Colon cancer Neg Hx   . Other Neg Hx   . Cancer Mother     breast - stage 0  . Cancer Maternal Aunt     breast cancer  . Cancer Maternal Aunt     ovarian  . Cancer Maternal Grandmother     breast or uterine   Patient Active Problem List   Diagnosis Date Noted  . Pancreatic cyst 12/19/2010  . Anemia 12/19/2010  . Family history of breast cancer in first degree relative 12/19/2010  . Hernia, ventral, s/p lap V[facility] repair w mesh 11/13/2010  . Diastasis recti s/p primary closure/repair 11/13/2010   No current outpatient prescriptions on file prior to visit.   No  current facility-administered medications on file prior to visit.       Review of Systems See HPI    Objective:   Physical Exam  Physical Exam  Nursing note and vitals reviewed.  Constitutional: She is oriented to person, place, and time. She appears well-developed and well-nourished.  HENT:  Head: Normocephalic and atraumatic.  Cardiovascular: Normal rate and regular rhythm. Exam reveals no gallop and no friction rub.  No murmur heard.  Pulmonary/Chest: Breath sounds normal. She has no wheezes. She has no rales.  Neurological: She is alert and oriented to person, place, and time.  Skin: Skin is warm and dry.  Psychiatric: She has a normal mood and affect. Her behavior is normal.            Assessment & Plan:  Fatigue  Will check TSH  CBC  Schedule CPE  Flu vaccine today

## 2013-01-14 ENCOUNTER — Encounter: Payer: Self-pay | Admitting: *Deleted

## 2013-01-14 LAB — VITAMIN D 25 HYDROXY (VIT D DEFICIENCY, FRACTURES): Vit D, 25-Hydroxy: 21 ng/mL — ABNORMAL LOW (ref 30–89)

## 2013-05-06 ENCOUNTER — Telehealth (INDEPENDENT_AMBULATORY_CARE_PROVIDER_SITE_OTHER): Payer: Self-pay

## 2013-05-06 NOTE — Telephone Encounter (Signed)
LMOM for pt to call me back. I am not sure about the CT scan she is talking about in the message that she left. I checked with Dr Johney Maine and we did not see any recent CT scans in the system just the one she had back in 2012. The pt will need to bring any recent CT info with her to the appt like a copy on CD along with the report. In the meantime until her appt with Dr Johney Maine the pt needs to be doing heat or ice to the sore areas along with taking Aleve or Ibuproen around the clock for 2 weeks per Dr Johney Maine. The pt has an appt with Dr Johney Maine on 05/15/13.

## 2013-05-06 NOTE — Telephone Encounter (Signed)
Patient called back to state that it is the CT from 2012 that she wants Dr. Johney Maine to review.  She states that the CT said something needed to be redone in 1 year.  Explained that Dr. Johney Maine will not order anything before he see's her however we do have that CT for him to review if necessary.  Patient states understanding and agreeable at this time.

## 2013-05-06 NOTE — Telephone Encounter (Signed)
Message copied by Illene Regulus on Wed May 06, 2013 10:39 AM ------      Message from: Darcey Nora      Created: Fri May 01, 2013  1:52 PM      Contact: [phone]       Lars Mage,       pt called she wants to see if Dr Johney Maine will look at her last CT scan and discuss this with her when she comes in 05/15/2013. She also wants him to know she is having a lot of pain around the [place] site from hernia repair we can call her at listed number      Thanks       sonya ------

## 2013-05-15 ENCOUNTER — Ambulatory Visit (INDEPENDENT_AMBULATORY_CARE_PROVIDER_SITE_OTHER): Payer: Managed Care, Other (non HMO) | Admitting: Surgery

## 2013-05-15 ENCOUNTER — Encounter (INDEPENDENT_AMBULATORY_CARE_PROVIDER_SITE_OTHER): Payer: Self-pay | Admitting: Surgery

## 2013-05-15 ENCOUNTER — Encounter (INDEPENDENT_AMBULATORY_CARE_PROVIDER_SITE_OTHER): Payer: Self-pay

## 2013-05-15 VITALS — BP 98/70 | HR 60 | Temp 97.4°F | Resp 14 | Ht 62.0 in | Wt 123.8 lb

## 2013-05-15 DIAGNOSIS — K863 Pseudocyst of pancreas: Secondary | ICD-10-CM

## 2013-05-15 DIAGNOSIS — K439 Ventral hernia without obstruction or gangrene: Secondary | ICD-10-CM

## 2013-05-15 DIAGNOSIS — R1031 Right lower quadrant pain: Secondary | ICD-10-CM

## 2013-05-15 DIAGNOSIS — K862 Cyst of pancreas: Secondary | ICD-10-CM

## 2013-05-15 NOTE — Patient Instructions (Signed)
Obtain an MRI scan of her abdomen to followup on the small pancreatic cystic mass and evaluate the pain in her right abdominal side.  Call us once that is done.  Follow up on the pancreas mass with her gastroenterologist, Dr. Oretha Caprice, with [facility] [phone]  Do heat and anti-inflammatories to see if this is a muscle pull/strain:  Managing Pain  Pain after surgery or related to activity is often due to strain/injury to muscle, tendon, nerves and/or incisions.  This pain is usually short-term and will improve in a few months.   Many people find it helpful to do the following things TOGETHER to help speed the process of healing and to get back to regular activity more quickly:  1. Avoid heavy physical activity a.  no lifting greater than 20 pounds b. Do not "push through" the pain.  Listen to your body and avoid positions and maneuvers than reproduce the pain c. Walking is okay as tolerated, but go slowly and stop when getting sore.  d. Remember: If it hurts to do it, then don't do it! 2. Take Anti-inflammatory medication  a. Take with food/snack around the clock for 1-2 weeks i. This helps the muscle and nerve tissues become less irritable and calm down faster b. Choose ONE of the following over-the-counter medications: i. Naproxen 220mg  tabs (ex. Aleve) 1-2 pills twice a day  ii. Ibuprofen 200mg  tabs (ex. Advil, Motrin) 3-4 pills with every meal and just before bedtime iii. Acetaminophen 500mg  tabs (Tylenol) 1-2 pills with every meal and just before bedtime 3. Use a Heating pad or Ice/Cold Pack a. 4-6 times a day b. May use warm bath/hottub  or showers 4. Try Gentle Massage and/or Stretching  a. at the area of pain many times a day b. stop if you feel pain - do not overdo it  Try these steps together to help you body heal faster and avoid making things get worse.  Doing just one of these things may not be enough.    If you are not getting better after two weeks  or are noticing you are getting worse, contact our office for further advice; we may need to re-evaluate you & see what other things we can do to help.  Start a fiber bowel regimen to have her stools soft and minimize the chance for constipation is contributing to this:  GETTING TO [place]. Irregular bowel habits such as constipation and diarrhea can lead to many problems over time.  Having one soft bowel movement a day is the most important way to prevent further problems.  The anorectal canal is designed to handle stretching and feces to safely manage our ability to get rid of solid waste (feces, poop, stool) out of our body.  BUT, hard constipated stools can act like ripping concrete bricks and diarrhea can be a burning fire to this very sensitive area of our body, causing inflamed hemorrhoids, anal fissures, increasing risk is perirectal abscesses, abdominal pain/bloating, an making irritable bowel worse.     The goal: ONE SOFT BOWEL MOVEMENT A DAY!  To have soft, regular bowel movements:    Drink at least 8 tall glasses of water a day.     Take plenty of fiber.  Fiber is the undigested part of plant food that passes into the colon, acting s "natures broom" to encourage bowel motility and movement.  Fiber can absorb and hold large amounts of water. This results in a larger, bulkier stool, which  is soft and easier to pass. Work gradually over several weeks up to 6 servings a day of fiber (25g a day even more if needed) in the form of: o Vegetables -- Root (potatoes, carrots, turnips), leafy green (lettuce, salad greens, celery, spinach), or cooked high residue (cabbage, broccoli, etc) o Fruit -- Fresh (unpeeled skin & pulp), Dried (prunes, apricots, cherries, etc ),  or stewed ( applesauce)  o Whole grain breads, pasta, etc (whole wheat)  o Bran cereals    Bulking Agents -- This type of water-retaining fiber generally is easily obtained each day by one of the following:  o Psyllium bran --  The psyllium plant is remarkable because its ground seeds can retain so much water. This product is available as Metamucil, Konsyl, Effersyllium, Per Diem Fiber, or the less expensive generic preparation in drug and health food stores. Although labeled a laxative, it really is not a laxative.  o Methylcellulose -- This is another fiber derived from wood which also retains water. It is available as Citrucel. o Polyethylene Glycol - and "artificial" fiber commonly called Miralax or Glycolax.  It is helpful for people with gassy or bloated feelings with regular fiber o Flax Seed - a less gassy fiber than psyllium   No reading or other relaxing activity while on the toilet. If bowel movements take longer than 5 minutes, you are too constipated   AVOID CONSTIPATION.  High fiber and water intake usually takes care of this.  Sometimes a laxative is needed to stimulate more frequent bowel movements, but    Laxatives are not a good long-term solution as it can wear the colon out. o Osmotics (Milk of Magnesia, Fleets phosphosoda, Magnesium citrate, MiraLax, GoLytely) are safer than  o Stimulants (Senokot, Castor Oil, Dulcolax, Ex Lax)    o Do not take laxatives for more than 7days in a row.    IF SEVERELY CONSTIPATED, try a Bowel Retraining Program: o Do not use laxatives.  o Eat a diet high in roughage, such as bran cereals and leafy vegetables.  o Drink six (6) ounces of prune or apricot juice each morning.  o Eat two (2) large servings of stewed fruit each day.  o Take one (1) heaping tablespoon of a psyllium-based bulking agent twice a day. Use sugar-free sweetener when possible to avoid excessive calories.  o Eat a normal breakfast.  o Set aside 15 minutes after breakfast to sit on the toilet, but do not strain to have a bowel movement.  o If you do not have a bowel movement by the third day, use an enema and repeat the above steps.    Controlling diarrhea o Switch to liquids and simpler foods for a  few days to avoid stressing your intestines further. o Avoid dairy products (especially milk & ice cream) for a short time.  The intestines often can lose the ability to digest lactose when stressed. o Avoid foods that cause gassiness or bloating.  Typical foods include beans and other legumes, cabbage, broccoli, and dairy foods.  Every person has some sensitivity to other foods, so listen to our body and avoid those foods that trigger problems for you. o Adding fiber (Citrucel, Metamucil, psyllium, Miralax) gradually can help thicken stools by absorbing excess fluid and retrain the intestines to act more normally.  Slowly increase the dose over a few weeks.  Too much fiber too soon can backfire and cause cramping & bloating. o Probiotics (such as active yogurt, Align, etc) may help  repopulate the intestines and colon with normal bacteria and calm down a sensitive digestive tract.  Most studies show it to be of mild help, though, and such products can be costly. o Medicines:   Bismuth subsalicylate (ex. Kayopectate, Pepto Bismol) every 30 minutes for up to 6 doses can help control diarrhea.  Avoid if pregnant.   Loperamide (Immodium) can slow down diarrhea.  Start with two tablets (4mg  total) first and then try one tablet every 6 hours.  Avoid if you are having fevers or severe pain.  If you are not better or start feeling worse, stop all medicines and call your doctor for advice o Call your doctor if you are getting worse or not better.  Sometimes further testing (cultures, endoscopy, X-ray studies, bloodwork, etc) may be needed to help diagnose and treat the cause of the diarrhea. o '

## 2013-05-15 NOTE — Progress Notes (Signed)
Subjective:     Patient ID: Shelley Long, female   DOB: 1976-03-17, 38 y.o.   MRN: [id number]  HPI  Note: This dictation was prepared with Dragon/digital dictation along with [facility] technology. Any transcriptional errors that result from this process are unintentional.       Shelley Long  July 19, 1975 [id number]  Patient Care Team: Lanice Shirts, MD as PCP - General (Internal Medicine) Milus Banister, MD as Consulting Physician (Gastroenterology) Cheri Fowler, MD as Consulting Physician (Obstetrics and Gynecology)  Procedure (Date: 04/02/2012):  Primary repair of supraumbilical diastases recti.  Laparoscopic underlay repair of diastasis recti and periumbilical ventral wall hernia with 20 x 15 cm mesh  This patient returns for surgical re-evaluation.  She notes a new pain in her right lower quadrant just inside her right hip in the RLQ abdomen.  It seems to be associated with some gas pain.  Sometimes associated with eating.  Sometimes associated with activity.  Usually lasts 1-2 hours.  No nausea or vomiting.  No her pain with activity but sometimes feels that when she coughs or twists.  She notes in general she has been feeling tired with her.  Feeling nauseated and paranoid.  Found to be anemic.  Vitamin D level.  Getting supplements for that.  She does not have pain in her central abdomen where her prior hernia surgery was.  She feels that is going well.  She has a palpable in every day.  No pain with defecation.  No blood.  She is eating well.  No personal nor family history of GI/colon cancer, inflammatory bowel disease, irritable bowel syndrome, allergy such as Celiac Sprue, dietary/dairy problems, colitis, ulcers nor gastritis.  No recent sick contacts/gastroenteritis.  No travel outside the country.  No changes in diet.  No dysphagia to solids or liquids.  No significant heartburn or reflux.  No hematochezia, hematemesis, coffee ground emesis.  No evidence of prior  gastric/peptic ulceration.  She also recalls being told she had a mass in her abdomen and needed followup.  She had questions about that.  Patient Active Problem List   Diagnosis Date Noted  . Abdominal pain, RLQ (right lower quadrant) 05/15/2013  . Pancreatic cyst 12/19/2010  . Anemia 12/19/2010  . Family history of breast cancer in first degree relative 12/19/2010  . Hernia, ventral, s/p lap [place] repair w mesh 11/13/2010    Past Medical History  Diagnosis Date  . Hx of ectopic pregnancy   . Abdominal pain   . Skin moles     abnormal per medical history form dated 11/13/10.  . Normal pregnancy 11/30/2011  . Delivery by vacuum extractor affecting fetus or newborn 11/30/2011  . Diastasis recti s/p primary closure/repair 11/13/2010    Past Surgical History  Procedure Laterality Date  . Cesarean section      x 1   . Ectopic pregnancy surgery  2010  . Ventral hernia repair  04/02/2012  . Hernia repair  04/02/12    lap ventral hernia repair    History   Social History  . Marital Status: Married    Spouse Name: N/A    Number of Children: N/A  . Years of Education: N/A   Occupational History  . CDW     Social History Main Topics  . Smoking status: Never Smoker   . Smokeless tobacco: Never Used  . Alcohol Use: Yes     Comment: social 1/2 drink per week; none with preg  . Drug Use: No  .  Sexual Activity: Yes    Partners: Male   Other Topics Concern  . Not on file   Social History Narrative   0 Caffeine drinks     Family History  Problem Relation Age of Onset  . Colon cancer Neg Hx   . Other Neg Hx   . Cancer Mother     breast - stage 0  . Cancer Maternal Aunt     breast cancer  . Cancer Maternal Aunt     ovarian  . Cancer Maternal Grandmother     breast or uterine    No current outpatient prescriptions on file.   No current facility-administered medications for this visit.     No Known Allergies  BP 98/70  Pulse 60  Temp(Src) 97.4 F (36.3 C)  (Temporal)  Resp 14  Ht 5\' 2"  (1.575 m)  Wt 123 lb 12.8 oz (56.155 kg)  BMI 22.64 kg/m2  No results found.   Review of Systems  Constitutional: Negative for fever, chills, diaphoresis, appetite change and fatigue.  HENT: Negative for ear discharge, ear pain, sore throat and trouble swallowing.   Eyes: Negative for photophobia, discharge and visual disturbance.  Respiratory: Negative for cough, choking, chest tightness and shortness of breath.   Cardiovascular: Negative for chest pain and palpitations.  Gastrointestinal: Positive for abdominal pain and abdominal distention. Negative for nausea, vomiting, diarrhea, constipation, anal bleeding and rectal pain.  Endocrine: Negative for cold intolerance and heat intolerance.  Genitourinary: Negative for dysuria, urgency, frequency, decreased urine volume, vaginal discharge, difficulty urinating, vaginal pain and pelvic pain.  Musculoskeletal: Negative for gait problem, myalgias and neck pain.  Skin: Negative for color change, pallor and rash.  Allergic/Immunologic: Negative for environmental allergies, food allergies and immunocompromised state.  Neurological: Negative for dizziness, speech difficulty, weakness and numbness.  Hematological: Negative for adenopathy.  Psychiatric/Behavioral: Negative for confusion and agitation. The patient is not nervous/anxious.        Objective:   Physical Exam  Constitutional: She is oriented to person, place, and time. She appears well-developed and well-nourished. No distress.  HENT:  Head: Normocephalic.  Mouth/Throat: Oropharynx is clear and moist. No oropharyngeal exudate.  Eyes: Conjunctivae and EOM are normal. Pupils are equal, round, and reactive to light. No scleral icterus.  Neck: Normal range of motion. Neck supple. No tracheal deviation present.  Cardiovascular: Normal rate, regular rhythm and intact distal pulses.   Pulmonary/Chest: Effort normal and breath sounds normal. No stridor. No  respiratory distress. She exhibits no tenderness.  Abdominal: Soft. Normal appearance and bowel sounds are normal. She exhibits no distension, no pulsatile liver, no fluid wave, no ascites and no mass. There is tenderness in the right lower quadrant. There is no rigidity, no rebound, no guarding, no tenderness at McBurney's point and negative Murphy's sign. No hernia. Hernia confirmed negative in the ventral area, confirmed negative in the right inguinal area and confirmed negative in the left inguinal area.    Genitourinary: No vaginal discharge found.  Musculoskeletal: Normal range of motion. She exhibits no tenderness.       Right elbow: She exhibits normal range of motion.       Left elbow: She exhibits normal range of motion.       Right wrist: She exhibits normal range of motion.       Left wrist: She exhibits normal range of motion.       Right hand: Normal strength noted.       Left hand: Normal strength noted.  Lymphadenopathy:       Head (right side): No posterior auricular adenopathy present.       Head (left side): No posterior auricular adenopathy present.    She has no cervical adenopathy.    She has no axillary adenopathy.       Right: No inguinal adenopathy present.       Left: No inguinal adenopathy present.  Neurological: She is alert and oriented to person, place, and time. No cranial nerve deficit. She exhibits normal muscle tone. Coordination normal.  Skin: Skin is warm and dry. No rash noted. She is not diaphoretic. No erythema.  Psychiatric: She has a normal mood and affect. Her behavior is normal. Judgment and thought content normal.     *RADIOLOGY REPORT*  Clinical Data: Abdominal pain with bloated area above the umbilicus  after eating a large meal. Evaluate for hernia. Postpartum 17  months ago.  CT ABDOMEN AND PELVIS WITH CONTRAST  Technique: Multidetector CT imaging of the abdomen and pelvis was  performed following the standard protocol during bolus    administration of intravenous contrast.  Contrast: 100 ml of Omnipaque 300% and oral contrast  Comparison: None.  Findings: There is a ventral abdominal bulge present just superior  to the umbilicus with an intact fascial plane. No definite  herniation through the linea alba is seen. Extraabdominal soft  tissue planes are otherwise maintained.  The lung bases appear clear.  The liver, spleen, adrenal glands, kidneys and gallbladder have a  normal post contrasted appearance. There is a cystic focus  identified in the pancreatic tail measuring 1.2 by 0.6 cm (image  29). In an asymptomatic patient, follow-up is recommended in 1  year to assess for stability, preferably by MRI. The remainder of  the pancreas appears normal and no associated ductal dilatation is  seen.  No focal bowel abnormalities are seen. The ileocecal valve and  appendix have a normal appearance  The uterus, ovaries and bladder have a normal post contrasted  appearance with a right corpus luteum noted.  No intra-abdominal or pelvic fluid, or inflammatory change is seen  and no pathologic adenopathy is noted.  Bone windows reveal no worrisome bone lesions. Vascular structures  appear intact.  IMPRESSION:  Ventral abdominal wall bulge with no definite fascial disruption is  seen to suggest hernia.  Pancreatic tail cyst with measurements as noted above. In an  asymptomatic patient, follow-up is recommended in 1 year to assess  for stability. This can be performed with abdominal CT with  contrast but is would preferably the performed with contrasted MRI  of the pancreas. (2010 - [employer] [facility])  Original Report Authenticated By: Ander Gaster, M.D.         Result Notes    Notes Recorded by Christian Mate, CMA on 10/30/2010 at 4:35 PM Pt aware [facility] 11/13/10 Recall CT 1 year ------  Notes Recorded by Christian Mate, CMA on 10/30/2010 at 3:43 PM Left message on machine to call back ------  Notes  Recorded by Owens Loffler, MD on 10/30/2010 at 2:48 PM   Please call the patient. There is a small cyst in pancreas that is likely NOT causing her symptoms. Should have repeat CT in 12 months (panc protocol) for it. Bulging on CT at site of her symptoms, not a clear hernia but she should still follow up with general surgeon to think about this, it is probably causing her symptoms.  Assessment:     New right anterior flank abdominal pain of uncertain etiology.  No evidence of hernia recurrence status post prior periumbilical diastases and hernia repair with mesh [id number].  Pancreatic cystic mass lost to followup despite efforts by gastroenterology & her primary care physician.     Plan:     I recommended that she try and control her pain with anti-inflammatories.  Naproxen 2 twice a day.  Heating pad 6 times a day.  Do to this for 3 weeks.  See if the abdominal pain fades away as would with a musculoskeletal strain. Activity as tolerated.  Low impact exercise such as walking an hour a day at least ideal.  Do not push through pain.  Diet as tolerated.  Low fat high fiber diet ideal.  Bowel regimen with 30 g fiber a day and fiber supplement as needed to avoid problems.  I suspect constipation may be a contributing factor.  This area seems too low for biliary colic/gallstones.  She does not have classic symptoms for it.  It is too lateral for any type of hernia aside for maybe a spigelian type hernia.    There are numerous prior notes of her gastroenterologist and primary care physician trying to get her to do followup scans of her small pancreatic cyst seen on a CAT scan in 2012.  It was delayed when she was pregnant.  It was delayed after my surgery.  Discussed with her gastroenterologist, Dr. Ardis Hughs.  MRI of the abdomen now while it is in her head and she is hoping to having followup..  This will allow [place] to evaluate the pancreatic cyst. Dr. Ardis Hughs feels that the pancreatic cyst has  not changed in size or character for 5 years, it is most likely benign it is not require further followup.  Also make sure there is no problems existing in the right abdomen.  If MRI is normal and she has no improvements with the bowel regimen and anti-inflammatories, consider seeing gastroenterology for colonoscopy/further gastroenterological workup.  Instructions discussed.  Followup with primary care physician for other health issues as would normally be done.  Consider screening for malignancies (breast, prostate, colon, melanoma, etc) as appropriate.  Questions answered.  The patient expressed understanding and appreciation

## 2013-05-19 ENCOUNTER — Telehealth (INDEPENDENT_AMBULATORY_CARE_PROVIDER_SITE_OTHER): Payer: Self-pay | Admitting: Surgery

## 2013-05-19 NOTE — Telephone Encounter (Addendum)
Patient with pancreatic mass found at tail of pancreas 2012.  Recommended followup study.  MRI preferred by radiology.  Lost to followup with pregnancy and other surgery.  Discussed with her gastroenterologist, Dr. Ardis Hughs.  He noted that major gastroenterology associations [place] recommending MRI of the abdomen to followup on pancreatic masses as well.  Therefore I ordered it.  Insurance company hesitant.  Requested peer-to-peer discussion.  After a few calls where the call was dropped, eventually was able to talk to an insurance doctor.  He agreed to proceed with MRI of the abdomen.

## 2013-05-20 ENCOUNTER — Encounter: Payer: BC Managed Care – PPO | Admitting: Internal Medicine

## 2013-05-20 ENCOUNTER — Other Ambulatory Visit: Payer: BC Managed Care – PPO

## 2013-05-20 DIAGNOSIS — Z0000 Encounter for general adult medical examination without abnormal findings: Secondary | ICD-10-CM

## 2013-05-25 ENCOUNTER — Ambulatory Visit
Admission: RE | Admit: 2013-05-25 | Discharge: 2013-05-25 | Disposition: A | Discharge status: Home / Self Care | Payer: Managed Care, Other (non HMO) | Source: Ambulatory Visit | Attending: Surgery | Admitting: Surgery

## 2013-05-25 DIAGNOSIS — K862 Cyst of pancreas: Secondary | ICD-10-CM

## 2013-05-25 DIAGNOSIS — R1031 Right lower quadrant pain: Secondary | ICD-10-CM

## 2013-05-25 MED ORDER — GADOBENATE DIMEGLUMINE 529 MG/ML IV SOLN
11.0000 mL | Freq: Once | INTRAVENOUS | Status: AC | PRN
  Administered 2013-05-25: 11 mL via INTRAVENOUS

## 2013-05-29 ENCOUNTER — Telehealth (INDEPENDENT_AMBULATORY_CARE_PROVIDER_SITE_OTHER): Payer: Self-pay

## 2013-05-29 NOTE — Telephone Encounter (Signed)
LMOM for pt to call me so I can give the results on the MRI per Dr Johney [place].

## 2013-05-29 NOTE — Telephone Encounter (Signed)
Pt returned my call. I advised her on the normal MRI abdomen per Dr Johney [place]. I advised her the pancreous is normal no cyst per Dr Johney [place]. I advised her no abnormalities on the right side of the abdomen per Dr Johney [place]. I advised her the impression on the MRI stated unremarkable exam per Dr Johney [place].

## 2013-09-01 ENCOUNTER — Telehealth (INDEPENDENT_AMBULATORY_CARE_PROVIDER_SITE_OTHER): Payer: Self-pay

## 2013-09-01 NOTE — Telephone Encounter (Signed)
Called pt back & went over MRI results.  No pancreatic mass/ tumor.  More c/w fat shadowing from the CTs scan.  Pelvic organs not checked since abd MRI & she had no pelvic symptoms.  She noted she thought she was having occasional hot flashes & some episodes of loose BMs past few months.  Traveling to [place] for a while & wanted records of the studies just in case.  She was going to d/w Gyn to make sure that her ovaries are OK.  I told her I thought that was a reasonable idea.  If BMs getting worse despite a bowel regimen, consider GI re-evaluation.  She expressed understanding & appreciation  Adin Hector, M.D., F.A.C.S. Gastrointestinal and Minimally Invasive Surgery [facility] [address], [place], [place] [address] [phone] Main / Paging

## 2013-09-01 NOTE — Telephone Encounter (Signed)
Pt called in questioning the results of an MRI done 05/25/13. Pt wants copies of images and questions if her ovaries were checked as well. Pt request c/b @ [phone].

## 2013-09-02 NOTE — Telephone Encounter (Signed)
i agree. 

## 2013-09-07 ENCOUNTER — Other Ambulatory Visit: Payer: Self-pay | Admitting: Internal Medicine

## 2013-09-07 ENCOUNTER — Ambulatory Visit (INDEPENDENT_AMBULATORY_CARE_PROVIDER_SITE_OTHER): Payer: Managed Care, Other (non HMO) | Admitting: Internal Medicine

## 2013-09-07 ENCOUNTER — Encounter: Payer: Self-pay | Admitting: Internal Medicine

## 2013-09-07 VITALS — BP 111/75 | HR 90 | Temp 98.1°F | Resp 16 | Wt 117.0 lb

## 2013-09-07 DIAGNOSIS — R531 Weakness: Secondary | ICD-10-CM

## 2013-09-07 DIAGNOSIS — R5383 Other fatigue: Secondary | ICD-10-CM

## 2013-09-07 DIAGNOSIS — R197 Diarrhea, unspecified: Secondary | ICD-10-CM

## 2013-09-07 DIAGNOSIS — R5381 Other malaise: Secondary | ICD-10-CM

## 2013-09-07 LAB — CBC WITH DIFFERENTIAL/PLATELET
BASOS ABS: 0 10*3/uL (ref 0.0–0.1)
BASOS PCT: 0 % (ref 0–1)
EOS ABS: 0.2 10*3/uL (ref 0.0–0.7)
EOS PCT: 3 % (ref 0–5)
HCT: 34.6 % — ABNORMAL LOW (ref 36.0–46.0)
Hemoglobin: 11.9 g/dL — ABNORMAL LOW (ref 12.0–15.0)
LYMPHS ABS: 1.7 10*3/uL (ref 0.7–4.0)
Lymphocytes Relative: 22 % (ref 12–46)
MCH: 28.1 pg (ref 26.0–34.0)
MCHC: 34.4 g/dL (ref 30.0–36.0)
MCV: 81.6 fL (ref 78.0–100.0)
Monocytes Absolute: 0.5 10*3/uL (ref 0.1–1.0)
Monocytes Relative: 6 % (ref 3–12)
NEUTROS PCT: 69 % (ref 43–77)
Neutro Abs: 5.3 10*3/uL (ref 1.7–7.7)
PLATELETS: 378 10*3/uL (ref 150–400)
RBC: 4.24 MIL/uL (ref 3.87–5.11)
RDW: 12.7 % (ref 11.5–15.5)
WBC: 7.7 10*3/uL (ref 4.0–10.5)

## 2013-09-07 LAB — SEDIMENTATION RATE: SED RATE: 14 mm/h (ref 0–22)

## 2013-09-07 NOTE — Progress Notes (Signed)
Subjective:    Patient ID: Shelley Long, female    DOB: 1976-03-19, 38 y.o.   MRN: [id number]  HPI Shelley Long is here for acute visit  She has concerns over long standing complaints that date back to last October or sooner. She was feeling depressed and anxious at that time and her gyn placed her on OC's   Since then she has "episodes" of gas, diarrhea asscoicated with anxiety and inability to sleep.  She has lost 4 lbs since 12/2012.  No chest pain, SOB or palpitations.   She feels quite weak.  No near syncope   Last travel was to [place] in November  No watery diarrhea "like pellets' no blood or dark stools.  She feels hot and cold during these episodes.      Recent abd hernia repair with mesh.  . No abd pain   LBM yesterday      No Known Allergies Past Medical History  Diagnosis Date  . Hx of ectopic pregnancy   . Abdominal pain   . Skin moles     abnormal per medical history form dated 11/13/10.  . Normal pregnancy 11/30/2011  . Delivery by vacuum extractor affecting fetus or newborn 11/30/2011  . Diastasis recti s/p primary closure/repair 11/13/2010   Past Surgical History  Procedure Laterality Date  . Cesarean section      x 1   . Ectopic pregnancy surgery  2010  . Ventral hernia repair  04/02/2012  . Hernia repair  04/02/12    lap ventral hernia repair   History   Social History  . Marital Status: Married    Spouse Name: N/A    Number of Children: N/A  . Years of Education: N/A   Occupational History  . CDW     Social History Main Topics  . Smoking status: Never Smoker   . Smokeless tobacco: Never Used  . Alcohol Use: Yes     Comment: social 1/2 drink per week; none with preg  . Drug Use: No  . Sexual Activity: Yes    Partners: Male   Other Topics Concern  . Not on file   Social History Narrative   0 Caffeine drinks    Family History  Problem Relation Age of Onset  . Colon cancer Neg Hx   . Other Neg Hx   . Cancer Mother     breast - stage 0  . Cancer  Maternal Aunt     breast cancer  . Cancer Maternal Aunt     ovarian  . Cancer Maternal Grandmother     breast or uterine   Patient Active Problem List   Diagnosis Date Noted  . Abdominal pain, RLQ (right lower quadrant) 05/15/2013  . Pancreatic cyst 12/19/2010  . Anemia 12/19/2010  . Family history of breast cancer in first degree relative 12/19/2010  . Hernia, ventral, s/p lap [place] repair w mesh 11/13/2010   No current outpatient prescriptions on file prior to visit.   No current facility-administered medications on file prior to visit.       Review of Systems See HPI    Objective:   Physical Exam Physical Exam  Nursing note and vitals reviewed.   See orthostatics  They are normal Constitutional: She is oriented to person, place, and time. She appears well-developed and well-nourished.  HENT:  Head: Normocephalic and atraumatic.  Cardiovascular: Normal rate and regular rhythm. Exam reveals no gallop and no friction rub.  No murmur heard.  Pulmonary/Chest: Breath  sounds normal. She has no wheezes. She has no rales.  Neurological: She is alert and oriented to person, place, and time.  Skin: Skin is warm and dry.  Psychiatric: She has a normal mood and affect. Her behavior is normal.              Assessment & Plan:  Diarrhea      Etiology unclear .  ADvised to eliminate dairy and spicy for now  Will check labs, TSH .  Advised to make appt with GI for possible endoscopy   S/S complex  Weakness, anxiety, insomnia  Will get labs may need imaging if no imporvement   EKG  NSR no acute chanages  Will see in 2 weeks or sooner prn

## 2013-09-07 NOTE — Patient Instructions (Signed)
Gvie pt number to Dr. Collene Mares  See me in 2weeks

## 2013-09-08 ENCOUNTER — Encounter: Payer: Self-pay | Admitting: Internal Medicine

## 2013-09-08 LAB — COMPREHENSIVE METABOLIC PANEL
ALK PHOS: 63 U/L (ref 39–117)
ALT: 13 U/L (ref 0–35)
AST: 16 U/L (ref 0–37)
Albumin: 4.1 g/dL (ref 3.5–5.2)
BILIRUBIN TOTAL: 0.2 mg/dL (ref 0.2–1.2)
BUN: 10 mg/dL (ref 6–23)
CO2: 22 mEq/L (ref 19–32)
CREATININE: 0.76 mg/dL (ref 0.50–1.10)
Calcium: 9.1 mg/dL (ref 8.4–10.5)
Chloride: 104 mEq/L (ref 96–112)
Glucose, Bld: 130 mg/dL — ABNORMAL HIGH (ref 70–99)
Potassium: 4.2 mEq/L (ref 3.5–5.3)
SODIUM: 141 meq/L (ref 135–145)
TOTAL PROTEIN: 7.3 g/dL (ref 6.0–8.3)

## 2013-09-08 LAB — T3, FREE: T3 FREE: 2.6 pg/mL (ref 2.3–4.2)

## 2013-09-08 LAB — ANA: ANA: NEGATIVE

## 2013-09-08 LAB — CORTISOL-AM, BLOOD: Cortisol - AM: 19.5 ug/dL (ref 4.3–22.4)

## 2013-09-08 LAB — H. PYLORI ANTIBODY, IGA: HELICOBACTER PYLORI AB, IGA: 3.8 U/mL (ref <9.0)

## 2013-09-08 LAB — RHEUMATOID FACTOR

## 2013-09-08 LAB — TSH: TSH: 1.221 u[IU]/mL (ref 0.350–4.500)

## 2013-09-08 LAB — T4, FREE: Free T4: 1.08 ng/dL (ref 0.80–1.80)

## 2013-09-24 ENCOUNTER — Ambulatory Visit (INDEPENDENT_AMBULATORY_CARE_PROVIDER_SITE_OTHER): Payer: Managed Care, Other (non HMO) | Admitting: Internal Medicine

## 2013-09-24 ENCOUNTER — Encounter: Payer: Self-pay | Admitting: Internal Medicine

## 2013-09-24 VITALS — BP 84/59 | HR 69 | Temp 98.1°F | Resp 18 | Wt 117.0 lb

## 2013-09-24 DIAGNOSIS — D649 Anemia, unspecified: Secondary | ICD-10-CM

## 2013-09-24 DIAGNOSIS — R197 Diarrhea, unspecified: Secondary | ICD-10-CM

## 2013-09-24 DIAGNOSIS — R5383 Other fatigue: Secondary | ICD-10-CM

## 2013-09-24 DIAGNOSIS — E559 Vitamin D deficiency, unspecified: Secondary | ICD-10-CM

## 2013-09-24 DIAGNOSIS — R5381 Other malaise: Secondary | ICD-10-CM

## 2013-09-24 NOTE — Progress Notes (Signed)
Subjective:    Patient ID: Shelley Long, female    DOB: 08/27/1975, 38 y.o.   MRN: [id number]  HPI Shelley Long is here for follow up on fatigue   See labs  She is taking a MVI with FE - not sure about vitamin D  She did see GI Who is checking for Celiac and will be giving her a pro-biotic.  [name]  No Known Allergies Past Medical History  Diagnosis Date  . Hx of ectopic pregnancy   . Abdominal pain   . Skin moles     abnormal per medical history form dated 11/13/10.  . Normal pregnancy 11/30/2011  . Delivery by vacuum extractor affecting fetus or newborn 11/30/2011  . Diastasis recti s/p primary closure/repair 11/13/2010   Past Surgical History  Procedure Laterality Date  . Cesarean section      x 1   . Ectopic pregnancy surgery  2010  . Ventral hernia repair  04/02/2012  . Hernia repair  04/02/12    lap ventral hernia repair   History   Social History  . Marital Status: Married    Spouse Name: N/A    Number of Children: N/A  . Years of Education: N/A   Occupational History  . CDW     Social History Main Topics  . Smoking status: Never Smoker   . Smokeless tobacco: Never Used  . Alcohol Use: Yes     Comment: social 1/2 drink per week; none with preg  . Drug Use: No  . Sexual Activity: Yes    Partners: Male   Other Topics Concern  . Not on file   Social History Narrative   0 Caffeine drinks    Family History  Problem Relation Age of Onset  . Colon cancer Neg Hx   . Other Neg Hx   . Cancer Mother     breast - stage 0  . Cancer Maternal Aunt     breast cancer  . Cancer Maternal Aunt     ovarian  . Cancer Maternal Grandmother     breast or uterine   Patient Active Problem List   Diagnosis Date Noted  . Abdominal pain, RLQ (right lower quadrant) 05/15/2013  . Pancreatic cyst 12/19/2010  . Anemia 12/19/2010  . Family history of breast cancer in first degree relative 12/19/2010  . Hernia, ventral, s/p lap [place] repair w mesh 11/13/2010   Current  Outpatient Prescriptions on File Prior to Visit  Medication Sig Dispense Refill  . desogestrel-ethinyl estradiol (VELIVET,CAZIANT,CESIA,CYCLESSA) 0.1/0.125/0.15 -0.025 MG tablet Take 1 tablet by mouth daily.       No current facility-administered medications on file prior to visit.       Review of Systems See HPI    Objective:   Physical Exam Physical Exam  Nursing note and vitals reviewed.  Constitutional: She is oriented to person, place, and time. She appears well-developed and well-nourished.  HENT:  Head: Normocephalic and atraumatic.  Cardiovascular: Normal rate and regular rhythm. Exam reveals no gallop and no friction rub.  No murmur heard.  Pulmonary/Chest: Breath sounds normal. She has no wheezes. She has no rales.  Neurological: She is alert and oriented to person, place, and time.  Skin: Skin is warm and dry.  Psychiatric: She has a normal mood and affect. Her behavior is normal.         Assessment & Plan:  Fatigue      Mild anemia  Advised to take MVI with FE  Will moniter  Low vitamin D  Advised [phone] units  Of vitamin D  Diarrhea  Evaluated by GI  See me as needed

## 2013-10-01 ENCOUNTER — Encounter: Payer: Self-pay | Admitting: *Deleted

## 2014-02-09 ENCOUNTER — Other Ambulatory Visit: Payer: Self-pay | Admitting: Obstetrics and Gynecology

## 2014-02-09 DIAGNOSIS — N644 Mastodynia: Secondary | ICD-10-CM

## 2014-02-12 ENCOUNTER — Other Ambulatory Visit: Payer: Self-pay | Admitting: Obstetrics and Gynecology

## 2014-02-12 DIAGNOSIS — N644 Mastodynia: Secondary | ICD-10-CM

## 2014-03-01 ENCOUNTER — Encounter: Payer: Self-pay | Admitting: Internal Medicine

## 2014-03-01 ENCOUNTER — Ambulatory Visit
Admission: RE | Admit: 2014-03-01 | Discharge: 2014-03-01 | Disposition: A | Discharge status: Home / Self Care | Payer: Managed Care, Other (non HMO) | Source: Ambulatory Visit | Attending: Obstetrics and Gynecology | Admitting: Obstetrics and Gynecology

## 2014-03-01 DIAGNOSIS — N644 Mastodynia: Secondary | ICD-10-CM

## 2014-10-08 ENCOUNTER — Ambulatory Visit (INDEPENDENT_AMBULATORY_CARE_PROVIDER_SITE_OTHER): Payer: Managed Care, Other (non HMO) | Admitting: Internal Medicine

## 2014-10-08 ENCOUNTER — Encounter: Payer: Self-pay | Admitting: Internal Medicine

## 2014-10-08 VITALS — BP 94/52 | HR 94 | Temp 98.3°F | Resp 18 | Ht 62.0 in | Wt 128.4 lb

## 2014-10-08 DIAGNOSIS — Z803 Family history of malignant neoplasm of breast: Secondary | ICD-10-CM | POA: Diagnosis not present

## 2014-10-08 DIAGNOSIS — Z0000 Encounter for general adult medical examination without abnormal findings: Secondary | ICD-10-CM | POA: Diagnosis not present

## 2014-10-08 NOTE — Progress Notes (Signed)
   Subjective:    Patient ID: Willette Brace, female    DOB: 28-May-1975, 39 y.o.   MRN: [id number]  HPI The patient is a 39 YO female coming in as a new patient for wellness. No complaints.   PMH, [facility], social history reviewed and updated.   Review of Systems  Constitutional: Negative for fever, activity change, appetite change and fatigue.  HENT: Negative.   Eyes: Negative.   Respiratory: Negative for cough, chest tightness and wheezing.   Cardiovascular: Negative for chest pain, palpitations and leg swelling.  Gastrointestinal: Negative for abdominal pain, diarrhea, constipation and abdominal distention.  Musculoskeletal: Negative.   Skin: Negative.   Neurological: Negative.   Psychiatric/Behavioral: Negative.       Objective:   Physical Exam  Constitutional: She is oriented to person, place, and time. She appears well-developed and well-nourished.  HENT:  Head: Normocephalic and atraumatic.  Eyes: EOM are normal.  Neck: Normal range of motion.  Cardiovascular: Normal rate and regular rhythm.   Pulmonary/Chest: Effort normal and breath sounds normal. No respiratory distress. She has no wheezes. She has no rales.  Abdominal: Soft. Bowel sounds are normal. She exhibits no distension. There is no tenderness. There is no rebound.  Musculoskeletal: She exhibits no edema.  Neurological: She is alert and oriented to person, place, and time. Coordination normal.  Skin: Skin is warm and dry.  Psychiatric: She has a normal mood and affect.   Filed Vitals:   10/08/14 1500  BP: 94/52  Pulse: 94  Temp: 98.3 F (36.8 C)  TempSrc: Oral  Resp: 18  Height: 5\' 2"  (1.575 m)  Weight: 128 lb 6.4 oz (58.242 kg)  SpO2: 95%      Assessment & Plan:

## 2014-10-08 NOTE — Patient Instructions (Signed)
We will see you back in about 1-2 years. If you have any problems sooner please feel free to call the office.   Health Maintenance Adopting a healthy lifestyle and getting preventive care can go a long way to promote health and wellness. Talk with your health care provider about what schedule of regular examinations is right for you. This is a good chance for you to check in with your provider about disease prevention and staying healthy. In between checkups, there are plenty of things you can do on your own. Experts have done a lot of research about which lifestyle changes and preventive measures are most likely to keep you healthy. Ask your health care provider for more information. WEIGHT AND DIET  Eat a healthy diet  Be sure to include plenty of vegetables, fruits, low-fat dairy products, and lean protein.  Do not eat a lot of foods high in solid fats, added sugars, or salt.  Get regular exercise. This is one of the most important things you can do for your health.  Most adults should exercise for at least 150 minutes each week. The exercise should increase your heart rate and make you sweat (moderate-intensity exercise).  Most adults should also do strengthening exercises at least twice a week. This is in addition to the moderate-intensity exercise.  Maintain a healthy weight  Body mass index (BMI) is a measurement that can be used to identify possible weight problems. It estimates body fat based on height and weight. Your health care provider can help determine your BMI and help you achieve or maintain a healthy weight.  For females 22 years of age and older:   A BMI below 18.5 is considered underweight.  A BMI of 18.5 to 24.9 is normal.  A BMI of 25 to 29.9 is considered overweight.  A BMI of 30 and above is considered obese.  Watch levels of cholesterol and blood lipids  You should start having your blood tested for lipids and cholesterol at 38 years of age, then have this  test every 5 years.  You may need to have your cholesterol levels checked more often if:  Your lipid or cholesterol levels are high.  You are older than 38 years of age.  You are at high risk for heart disease.  CANCER SCREENING   Lung Cancer  Lung cancer screening is recommended for adults 41-91 years old who are at high risk for lung cancer because of a history of smoking.  A yearly low-dose CT scan of the lungs is recommended for people who:  Currently smoke.  Have quit within the past 15 years.  Have at least a 30-pack-year history of smoking. A pack year is smoking an average of one pack of cigarettes a day for 1 year.  Yearly screening should continue until it has been 15 years since you quit.  Yearly screening should stop if you develop a health problem that would prevent you from having lung cancer treatment.  Breast Cancer  Practice breast self-awareness. This means understanding how your breasts normally appear and feel.  It also means doing regular breast self-exams. Let your health care provider know about any changes, no matter how small.  If you are in your 20s or 30s, you should have a clinical breast exam (CBE) by a health care provider every 1-3 years as part of a regular health exam.  If you are 31 or older, have a CBE every year. Also consider having a breast X-ray (mammogram) every  year.  If you have a family history of breast cancer, talk to your health care provider about genetic screening.  If you are at high risk for breast cancer, talk to your health care provider about having an MRI and a mammogram every year.  Breast cancer gene (BRCA) assessment is recommended for women who have family members with BRCA-related cancers. BRCA-related cancers include:  Breast.  Ovarian.  Tubal.  Peritoneal cancers.  Results of the assessment will determine the need for genetic counseling and BRCA1 and BRCA2 testing. Cervical Cancer Routine pelvic  examinations to screen for cervical cancer are no longer recommended for nonpregnant women who are considered low risk for cancer of the pelvic organs (ovaries, uterus, and vagina) and who do not have symptoms. A pelvic examination may be necessary if you have symptoms including those associated with pelvic infections. Ask your health care provider if a screening pelvic exam is right for you.   The Pap test is the screening test for cervical cancer for women who are considered at risk.  If you had a hysterectomy for a problem that was not cancer or a condition that could lead to cancer, then you no longer need Pap tests.  If you are older than 65 years, and you have had normal Pap tests for the past 10 years, you no longer need to have Pap tests.  If you have had past treatment for cervical cancer or a condition that could lead to cancer, you need Pap tests and screening for cancer for at least 20 years after your treatment.  If you no longer get a Pap test, assess your risk factors if they change (such as having a new sexual partner). This can affect whether you should start being screened again.  Some women have medical problems that increase their chance of getting cervical cancer. If this is the case for you, your health care provider may recommend more frequent screening and Pap tests.  The human papillomavirus (HPV) test is another test that may be used for cervical cancer screening. The HPV test looks for the virus that can cause cell changes in the cervix. The cells collected during the Pap test can be tested for HPV.  The HPV test can be used to screen women 37 years of age and older. Getting tested for HPV can extend the interval between normal Pap tests from three to five years.  An HPV test also should be used to screen women of any age who have unclear Pap test results.  After 38 years of age, women should have HPV testing as often as Pap tests.  Colorectal Cancer  This type of  cancer can be detected and often prevented.  Routine colorectal cancer screening usually begins at 38 years of age and continues through 38 years of age.  Your health care provider may recommend screening at an earlier age if you have risk factors for colon cancer.  Your health care provider may also recommend using home test kits to check for hidden blood in the stool.  A small camera at the end of a tube can be used to examine your colon directly (sigmoidoscopy or colonoscopy). This is done to check for the earliest forms of colorectal cancer.  Routine screening usually begins at age 51.  Direct examination of the colon should be repeated every 5-10 years through 38 years of age. However, you may need to be screened more often if early forms of precancerous polyps or small growths are found.  Skin Cancer  Check your skin from head to toe regularly.  Tell your health care provider about any new moles or changes in moles, especially if there is a change in a mole's shape or color.  Also tell your health care provider if you have a mole that is larger than the size of a pencil eraser.  Always use sunscreen. Apply sunscreen liberally and repeatedly throughout the day.  Protect yourself by wearing long sleeves, pants, a wide-brimmed hat, and sunglasses whenever you are outside. HEART DISEASE, DIABETES, AND HIGH BLOOD PRESSURE   Have your blood pressure checked at least every 1-2 years. High blood pressure causes heart disease and increases the risk of stroke.  If you are between 64 years and 21 years old, ask your health care provider if you should take aspirin to prevent strokes.  Have regular diabetes screenings. This involves taking a blood sample to check your fasting blood sugar level.  If you are at a normal weight and have a low risk for diabetes, have this test once every three years after 38 years of age.  If you are overweight and have a high risk for diabetes, consider being  tested at a younger age or more often. PREVENTING INFECTION  Hepatitis B  If you have a higher risk for hepatitis B, you should be screened for this virus. You are considered at high risk for hepatitis B if:  You were born in a country where hepatitis B is common. Ask your health care provider which countries are considered high risk.  Your parents were born in a high-risk country, and you have not been immunized against hepatitis B (hepatitis B vaccine).  You have HIV or AIDS.  You use needles to inject street drugs.  You live with someone who has hepatitis B.  You have had sex with someone who has hepatitis B.  You get hemodialysis treatment.  You take certain medicines for conditions, including cancer, organ transplantation, and autoimmune conditions. Hepatitis C  Blood testing is recommended for:  Everyone born from [birth year] through 1964.  Anyone with known risk factors for hepatitis C. Sexually transmitted infections (STIs)  You should be screened for sexually transmitted infections (STIs) including gonorrhea and chlamydia if:  You are sexually active and are younger than 38 years of age.  You are older than 38 years of age and your health care provider tells you that you are at risk for this type of infection.  Your sexual activity has changed since you were last screened and you are at an increased risk for chlamydia or gonorrhea. Ask your health care provider if you are at risk.  If you do not have HIV, but are at risk, it may be recommended that you take a prescription medicine daily to prevent HIV infection. This is called pre-exposure prophylaxis (PrEP). You are considered at risk if:  You are sexually active and do not regularly use condoms or know the HIV status of your partner(s).  You take drugs by injection.  You are sexually active with a partner who has HIV. Talk with your health care provider about whether you are at high risk of being infected with HIV. If  you choose to begin PrEP, you should first be tested for HIV. You should then be tested every 3 months for as long as you are taking PrEP.  PREGNANCY   If you are premenopausal and you may become pregnant, ask your health care provider about preconception counseling.  If you  may become pregnant, take 400 to 800 micrograms (mcg) of folic acid every day.  If you want to prevent pregnancy, talk to your health care provider about birth control (contraception). OSTEOPOROSIS AND MENOPAUSE   Osteoporosis is a disease in which the bones lose minerals and strength with aging. This can result in serious bone fractures. Your risk for osteoporosis can be identified using a bone density scan.  If you are 30 years of age or older, or if you are at risk for osteoporosis and fractures, ask your health care provider if you should be screened.  Ask your health care provider whether you should take a calcium or vitamin D supplement to lower your risk for osteoporosis.  Menopause may have certain physical symptoms and risks.  Hormone replacement therapy may reduce some of these symptoms and risks. Talk to your health care provider about whether hormone replacement therapy is right for you.  HOME CARE INSTRUCTIONS   Schedule regular health, dental, and eye exams.  Stay current with your immunizations.   Do not use any tobacco products including cigarettes, chewing tobacco, or electronic cigarettes.  If you are pregnant, do not drink alcohol.  If you are breastfeeding, limit how much and how often you drink alcohol.  Limit alcohol intake to no more than 1 drink per day for nonpregnant women. One drink equals 12 ounces of beer, 5 ounces of wine, or 1 ounces of hard liquor.  Do not use street drugs.  Do not share needles.  Ask your health care provider for help if you need support or information about quitting drugs.  Tell your health care provider if you often feel depressed.  Tell your health  care provider if you have ever been abused or do not feel safe at home. Document Released: 10/30/2010 Document Revised: 08/31/2013 Document Reviewed: 03/18/2013 [facility] Patient Information 2015 [place], [place]. This information is not intended to replace advice given to you by your health care provider. Make sure you discuss any questions you have with your health care provider.

## 2014-10-08 NOTE — Progress Notes (Signed)
Pre visit review using our clinic review tool, if applicable. No additional management support is needed unless otherwise documented below in the visit note. 

## 2014-10-08 NOTE — Assessment & Plan Note (Signed)
Stays up to date on screening.

## 2014-10-08 NOTE — Assessment & Plan Note (Signed)
Will check labs at next visit. Reviewed recent labs and lipids which were normal. She is active and eats fairly well. She is vegetarian and takes multivitamin daily. Non-smoker. Gets yearly screening for breast and with GYN for pap.

## 2015-02-14 ENCOUNTER — Ambulatory Visit: Payer: Managed Care, Other (non HMO) | Admitting: Family

## 2015-02-14 ENCOUNTER — Emergency Department (HOSPITAL_COMMUNITY): Payer: Managed Care, Other (non HMO)

## 2015-02-14 ENCOUNTER — Encounter (HOSPITAL_COMMUNITY): Payer: Self-pay | Admitting: Family Medicine

## 2015-02-14 ENCOUNTER — Emergency Department (HOSPITAL_COMMUNITY)
Admission: EM | Admit: 2015-02-14 | Discharge: 2015-02-14 | Disposition: A | Discharge status: Home / Self Care | Payer: Managed Care, Other (non HMO) | Attending: Emergency Medicine | Admitting: Emergency Medicine

## 2015-02-14 ENCOUNTER — Telehealth: Payer: Self-pay | Admitting: Internal Medicine

## 2015-02-14 DIAGNOSIS — R0602 Shortness of breath: Secondary | ICD-10-CM | POA: Insufficient documentation

## 2015-02-14 DIAGNOSIS — J029 Acute pharyngitis, unspecified: Secondary | ICD-10-CM | POA: Insufficient documentation

## 2015-02-14 DIAGNOSIS — Z79899 Other long term (current) drug therapy: Secondary | ICD-10-CM | POA: Insufficient documentation

## 2015-02-14 DIAGNOSIS — R079 Chest pain, unspecified: Secondary | ICD-10-CM | POA: Insufficient documentation

## 2015-02-14 LAB — BASIC METABOLIC PANEL
Anion gap: 9 (ref 5–15)
BUN: 6 mg/dL (ref 6–20)
CHLORIDE: 103 mmol/L (ref 101–111)
CO2: 25 mmol/L (ref 22–32)
Calcium: 9.4 mg/dL (ref 8.9–10.3)
Creatinine, Ser: 0.74 mg/dL (ref 0.44–1.00)
GFR calc Af Amer: >60 mL/min (ref >60)
Glucose, Bld: 93 mg/dL (ref 65–99)
POTASSIUM: 3.7 mmol/L (ref 3.5–5.1)
SODIUM: 137 mmol/L (ref 135–145)

## 2015-02-14 LAB — CBC
HEMATOCRIT: 35.2 % — AB (ref 36.0–46.0)
Hemoglobin: 12.2 g/dL (ref 12.0–15.0)
MCH: 28.4 pg (ref 26.0–34.0)
MCHC: 34.7 g/dL (ref 30.0–36.0)
MCV: 81.9 fL (ref 78.0–100.0)
Platelets: 366 10*3/uL (ref 150–400)
RBC: 4.3 MIL/uL (ref 3.87–5.11)
RDW: 12.2 % (ref 11.5–15.5)
WBC: 6.2 10*3/uL (ref 4.0–10.5)

## 2015-02-14 LAB — I-STAT TROPONIN, ED
TROPONIN I, POC: 0 ng/mL (ref 0.00–0.08)
Troponin i, poc: 0 ng/mL (ref 0.00–0.08)

## 2015-02-14 MED ORDER — SIMETHICONE 80 MG PO CHEW
80.0000 mg | CHEWABLE_TABLET | Freq: Once | ORAL | Status: AC
  Administered 2015-02-14: 80 mg via ORAL
  Filled 2015-02-14: qty 1

## 2015-02-14 MED ORDER — PANTOPRAZOLE SODIUM 40 MG PO TBEC
40.0000 mg | DELAYED_RELEASE_TABLET | Freq: Every day | ORAL | Status: DC
Start: 2015-02-14 — End: 2015-02-14
  Administered 2015-02-14: 40 mg via ORAL
  Filled 2015-02-14: qty 1

## 2015-02-14 MED ORDER — OMEPRAZOLE 20 MG PO CPDR: 20.0000 mg | DELAYED_RELEASE_CAPSULE | Freq: Every day | ORAL | Status: DC

## 2015-02-14 MED ORDER — SIMETHICONE 80 MG PO CHEW: 80.0000 mg | CHEWABLE_TABLET | Freq: Four times a day (QID) | ORAL | Status: DC | PRN

## 2015-02-14 NOTE — ED Provider Notes (Signed)
CSN: [id number]     Arrival date & time 02/14/15  1131 History  By signing my name below, I, Essence Howell, attest that this documentation has been prepared under the direction and in the presence of Ottie Glazier, PA-C Electronically Signed: Ladene Artist, ED Scribe 02/14/2015 at 2:46 PM.   Chief Complaint  Patient presents with  . Chest Pain   The history is provided by the patient. No language interpreter was used.   HPI Comments: Shelley Long is a 39 y.o. female who presents to the Emergency Department complaining of persistent chest pain that began 3 nights ago. Pt states that chest pain is worsened with lying down. She reports associated heartburn, increased burping, intermittent chest heaviness that is worse with exhaling, SOB at night, sore throat at night that resolved by the morning and insomnia for the past 3 days. She has been treating with GasX with minimal relief. Pt denies previous stress test or echo. She denies estrogen use. No h/o HTN, DM or high cholesterol. Pt is a nonsmoker.   Past Medical History  Diagnosis Date  . Hx of ectopic pregnancy   . Abdominal pain   . Skin moles     abnormal per medical history form dated 11/13/10.  . Normal pregnancy 11/30/2011  . Delivery by vacuum extractor affecting fetus or newborn 11/30/2011  . Diastasis recti s/p primary closure/repair 11/13/2010   Past Surgical History  Procedure Laterality Date  . Cesarean section      x 1   . Ectopic pregnancy surgery  2010  . Ventral hernia repair  04/02/2012  . Hernia repair  04/02/12    lap ventral hernia repair   Family History  Problem Relation Age of Onset  . Colon cancer Neg Hx   . Other Neg Hx   . Cancer Mother     breast - stage 0  . Cancer Maternal Aunt     breast cancer  . Cancer Maternal Aunt     ovarian  . Cancer Maternal Grandmother     breast or uterine   Social History  Substance Use Topics  . Smoking status: Never Smoker   . Smokeless tobacco: Never Used  .  Alcohol Use: Yes     Comment: social 1/2 drink per week; none with preg   OB History    Gravida Para Term Preterm AB TAB SAB Ectopic Multiple Living   3 2 2  1   1  2      Review of Systems  Constitutional: Negative for fever and chills.  HENT: Positive for sore throat.   Respiratory: Positive for shortness of breath.   Cardiovascular: Positive for chest pain. Negative for leg swelling.  Gastrointestinal: Negative for nausea and vomiting.  Psychiatric/Behavioral: Positive for sleep disturbance.  All other systems reviewed and are negative.  Allergies  Review of patient's allergies indicates no known allergies.  Home Medications   Prior to Admission medications   Medication Sig Start Date End Date Taking? Authorizing Provider  desogestrel-ethinyl estradiol (VELIVET,CAZIANT,CESIA,CYCLESSA) 0.1/0.125/0.15 -0.025 MG tablet Take 1 tablet by mouth daily.    Historical Provider, MD  MULTIPLE VITAMIN PO Take 1 tablet by mouth.    Historical Provider, MD  omeprazole (PRILOSEC) 20 MG capsule Take 1 capsule (20 mg total) by mouth daily. 02/14/15   Sadie Hazelett Patel-Mills, PA-C   BP 98/79 mmHg  Pulse 61  Temp(Src) 98.2 F (36.8 C)  Resp 18  Ht 5\' 1"  (1.549 m)  Wt 126 lb 2 oz (57.21  kg)  BMI 23.84 kg/m2  SpO2 100%  LMP 01/31/2015 Physical Exam  Constitutional: She is oriented to person, place, and time. She appears well-developed and well-nourished. No distress.  HENT:  Head: Normocephalic and atraumatic.  Eyes: Conjunctivae and EOM are normal.  Neck: Neck supple. No tracheal deviation present.  Cardiovascular: Normal rate, regular rhythm and normal heart sounds.   Pulmonary/Chest: Effort normal and breath sounds normal. No respiratory distress. She has no wheezes. She has no rhonchi.  Lungs are clear to auscultation bilaterally. No reproducible chest tenderness.   No respiratory distress or shortness of breath. No use of accessory muscles.  Patient is well-appearing and sitting  comfortably.  Abdominal: Soft. She exhibits no distension. There is no tenderness.  Musculoskeletal: Normal range of motion. She exhibits no edema.  No calf Swelling or tenderness.  Neurological: She is alert and oriented to person, place, and time.  Skin: Skin is warm and dry.  Psychiatric: She has a normal mood and affect. Her behavior is normal.  Nursing note and vitals reviewed.  ED Course  Procedures (including critical care time) DIAGNOSTIC STUDIES: Oxygen Saturation is 100% on RA, normal by my interpretation.    COORDINATION OF CARE: 2:32 PM-Discussed treatment plan which includes Protonix with pt at bedside and pt agreed to plan.   Labs Review Labs Reviewed  CBC - Abnormal; Notable for the following:    HCT 35.2 (*)    All other components within normal limits  BASIC METABOLIC PANEL  I-STAT TROPOININ, ED  Randolm Idol, ED   Imaging Review Dg Chest 2 View  02/14/2015  CLINICAL DATA:  39 year old female with a history of left-sided chest pain episodic for 3 days EXAM: CHEST - 2 VIEW COMPARISON:  None. FINDINGS: Cardiomediastinal silhouette projects within normal limits in size and contour. No confluent airspace disease, pneumothorax, or pleural effusion. No displaced fracture. Unremarkable appearance of the upper abdomen. IMPRESSION: No radiographic evidence of acute cardiopulmonary disease. Signed, Dulcy Fanny. Earleen Newport, DO [facility] Electronically Signed   By: Corrie Mckusick D.O.   On: 02/14/2015 12:40   I have personally reviewed and evaluated these images and lab results as part of my medical decision-making.   EKG Interpretation None      MDM   Final diagnoses:  Chest pain, unspecified chest pain type  Patient presents for atypical chest pain, and insomnia 3 days. She states that it is relieved with burping and is worse with lying supine. She has a history of acid reflux. Her labs are unremarkable and  chest x-ray is negative for pneumonia or edema. Her first and second troponin are negative. Her EKG is not concerning. I discussed the findings with the patient. I believe this is most likely due to GERD. She is not currently taking anything for this. I prescribed omeprazole and simethicone. I reviewed the heart score and she is at low risk for any major cardiac event. She is PERC negative. I discussed return precautions with the patient. I also discussed following up with her primary care physician for possible stress test. She verbally agrees with the plan.  I personally performed the services described in this documentation, which was scribed in my presence. The recorded information has been reviewed and is accurate.    Ottie Glazier, PA-C 02/14/15 [place], MD 02/16/15 [phone]

## 2015-02-14 NOTE — Telephone Encounter (Signed)
Patient Name: Shelley Long [facility] DOB: 05-Apr-1976 Initial Comment Caller states, has chest pains for the last few days, in the middle, gas ex has not helped any. Nurse Assessment Nurse: Ronnald Ramp, RN, Miranda Date/Time (Eastern Time): 02/14/2015 10:04:53 AM Confirm and document reason for call. If symptomatic, describe symptoms. ---Caller states she has been having pain in the middle of her chest for the last 2-3 days. The pain comes and goes but causing difficulty sleeping. Has the patient traveled out of the country within the last 30 days? ---Not Applicable Does the patient have any new or worsening symptoms? ---Yes Will a triage be completed? ---Yes Related visit to physician within the last 2 weeks? ---No Does the PT have any chronic conditions? (i.e. diabetes, asthma, etc.) ---No Did the patient indicate they were pregnant? ---No Guidelines Guideline Title Affirmed Question Affirmed Notes Chest Pain Chest pain lasts > 5 minutes (Exceptions: chest pain occurring > 3 days ago and now asymptomatic; same as previously diagnosed heartburn and has accompanying sour taste in mouth) Final Disposition User Go to ED Now (or PCP triage) Ronnald Ramp, RN, Miranda Comments Caller states the office already scheduled her an appt for 3pm today with Dr. Elna Breslow. Told caller that I recommend that she be seen at the [place] within the hr. She declined and states she just wants to keep the appt. I did check the schedule and no earlier appts available.  Referrals REFERRED TO PCP OFFICE Disagree/Comply: Disagree Disagree/Comply Reason: Disagree with instructions

## 2015-02-14 NOTE — ED Notes (Signed)
Pt here for chest pain, insomnia x 3 day. sts the pain is central and is relieved with burping. sts also some burning. Hx of reflux.

## 2015-02-14 NOTE — Discharge Instructions (Signed)
Nonspecific Chest Pain Follow up with your primary care physician for possible stress test. Take omeprazole daily for acid reduction. You can also take Gas-X as needed. It is often hard to find the cause of chest pain. There is always a chance that your pain could be related to something serious, such as a heart attack or a blood clot in your lungs. Chest pain can also be caused by conditions that are not life-threatening. If you have chest pain, it is very important to follow up with your doctor.  HOME CARE  If you were prescribed an antibiotic medicine, finish it all even if you start to feel better.  Avoid any activities that cause chest pain.  Do not use any tobacco products, including cigarettes, chewing tobacco, or electronic cigarettes. If you need help quitting, ask your doctor.  Do not drink alcohol.  Take medicines only as told by your doctor.  Keep all follow-up visits as told by your doctor. This is important. This includes any further testing if your chest pain does not go away.  Your doctor may tell you to keep your head raised (elevated) while you sleep.  Make lifestyle changes as told by your doctor. These may include:  Getting regular exercise. Ask your doctor to suggest some activities that are safe for you.  Eating a heart-healthy diet. Your doctor or a diet specialist (dietitian) can help you to learn healthy eating options.  Maintaining a healthy weight.  Managing diabetes, if necessary.  Reducing stress. GET HELP IF:  Your chest pain does not go away, even after treatment.  You have a rash with blisters on your chest.  You have a fever. GET HELP RIGHT AWAY IF:  Your chest pain is worse.  You have an increasing cough, or you cough up blood.  You have severe belly (abdominal) pain.  You feel extremely weak.  You pass out (faint).  You have chills.  You have sudden, unexplained chest discomfort.  You have sudden, unexplained discomfort in your  arms, back, neck, or jaw.  You have shortness of breath at any time.  You suddenly start to sweat, or your skin gets clammy.  You feel nauseous.  You vomit.  You suddenly feel light-headed or dizzy.  Your heart begins to beat quickly, or it feels like it is skipping beats. These symptoms may be an emergency. Do not wait to see if the symptoms will go away. Get medical help right away. Call your local emergency services (911 in the [place] Do not drive yourself to the hospital.   This information is not intended to replace advice given to you by your health care provider. Make sure you discuss any questions you have with your health care provider.   Document Released: 10/03/2007 Document Revised: 05/07/2014 Document Reviewed: 11/20/2013 Elsevier Interactive Patient Education [employer].

## 2015-02-24 ENCOUNTER — Ambulatory Visit: Payer: Managed Care, Other (non HMO) | Admitting: Internal Medicine

## 2015-03-01 ENCOUNTER — Ambulatory Visit: Payer: Managed Care, Other (non HMO) | Admitting: Internal Medicine

## 2015-03-01 DIAGNOSIS — Z0289 Encounter for other administrative examinations: Secondary | ICD-10-CM

## 2015-03-18 ENCOUNTER — Telehealth: Payer: Self-pay | Admitting: Internal Medicine

## 2015-03-18 MED ORDER — IVERMECTIN 0.5 % EX LOTN: TOPICAL_LOTION | CUTANEOUS | Status: DC

## 2015-03-18 NOTE — Telephone Encounter (Signed)
Patient has lice and is hoping you can call in a prescription for Spice.  She is hoping to get this asap for she is leaving on a trip. [facility] [place]

## 2015-03-18 NOTE — Telephone Encounter (Signed)
Called pharmacy and verified the type of rx.  erx done.  Informed pt of the same.

## 2015-03-18 NOTE — Telephone Encounter (Signed)
Rx OK

## 2015-03-18 NOTE — Telephone Encounter (Signed)
SKLICE is the requested rx.

## 2016-03-08 ENCOUNTER — Encounter: Payer: Self-pay | Admitting: Internal Medicine

## 2016-03-21 ENCOUNTER — Other Ambulatory Visit (INDEPENDENT_AMBULATORY_CARE_PROVIDER_SITE_OTHER): Payer: BLUE CROSS/BLUE SHIELD

## 2016-03-21 ENCOUNTER — Encounter: Payer: Self-pay | Admitting: Internal Medicine

## 2016-03-21 ENCOUNTER — Ambulatory Visit (INDEPENDENT_AMBULATORY_CARE_PROVIDER_SITE_OTHER): Payer: BLUE CROSS/BLUE SHIELD | Admitting: Internal Medicine

## 2016-03-21 VITALS — BP 114/62 | HR 71 | Temp 98.3°F | Resp 16 | Ht 62.0 in | Wt 128.0 lb

## 2016-03-21 DIAGNOSIS — Z0000 Encounter for general adult medical examination without abnormal findings: Secondary | ICD-10-CM

## 2016-03-21 DIAGNOSIS — Z23 Encounter for immunization: Secondary | ICD-10-CM | POA: Diagnosis not present

## 2016-03-21 LAB — COMPREHENSIVE METABOLIC PANEL
ALT: 12 U/L (ref 0–35)
AST: 16 U/L (ref 0–37)
Albumin: 4.1 g/dL (ref 3.5–5.2)
Alkaline Phosphatase: 69 U/L (ref 39–117)
BUN: 10 mg/dL (ref 6–23)
CO2: 26 meq/L (ref 19–32)
Calcium: 9.2 mg/dL (ref 8.4–10.5)
Chloride: 104 mEq/L (ref 96–112)
Creatinine, Ser: 0.75 mg/dL (ref 0.40–1.20)
GFR: 91.15 mL/min (ref >60.00)
GLUCOSE: 93 mg/dL (ref 70–99)
Potassium: 4 mEq/L (ref 3.5–5.1)
Sodium: 137 mEq/L (ref 135–145)
Total Bilirubin: 0.2 mg/dL (ref 0.2–1.2)
Total Protein: 7.3 g/dL (ref 6.0–8.3)

## 2016-03-21 LAB — LIPID PANEL
Cholesterol: 202 mg/dL — ABNORMAL HIGH (ref 0–200)
HDL: 59 mg/dL (ref >39.00)
LDL CALC: 121 mg/dL — AB (ref 0–99)
NONHDL: 142.73
TRIGLYCERIDES: 108 mg/dL (ref 0.0–149.0)
Total CHOL/HDL Ratio: 3
VLDL: 21.6 mg/dL (ref 0.0–40.0)

## 2016-03-21 LAB — CBC
HCT: 34.6 % — ABNORMAL LOW (ref 36.0–46.0)
HEMOGLOBIN: 12 g/dL (ref 12.0–15.0)
MCHC: 34.5 g/dL (ref 30.0–36.0)
MCV: 80.7 fl (ref 78.0–100.0)
Platelets: 378 10*3/uL (ref 150.0–400.0)
RBC: 4.29 Mil/uL (ref 3.87–5.11)
RDW: 12.3 % (ref 11.5–15.5)
WBC: 5.5 10*3/uL (ref 4.0–10.5)

## 2016-03-21 LAB — VITAMIN B12: VITAMIN B 12: 205 pg/mL — AB (ref 211–911)

## 2016-03-21 NOTE — Progress Notes (Signed)
   Subjective:    Patient ID: Shelley Long, female    DOB: 04/02/1976, 40 y.o.   MRN: [id number]  HPI The patient is a 40 YO female coming in for wellness. No new concerns.   PMH, [facility], social history reviewed and updated.   Review of Systems  Constitutional: Negative.   HENT: Negative.   Eyes: Negative.   Respiratory: Negative for cough, chest tightness and shortness of breath.   Cardiovascular: Negative for chest pain, palpitations and leg swelling.  Gastrointestinal: Negative for abdominal distention, abdominal pain, constipation, diarrhea, nausea and vomiting.  Musculoskeletal: Negative.   Skin: Negative.   Neurological: Negative.   Psychiatric/Behavioral: Negative.       Objective:   Physical Exam  Constitutional: She is oriented to person, place, and time. She appears well-developed and well-nourished.  HENT:  Head: Normocephalic and atraumatic.  Eyes: EOM are normal.  Neck: Normal range of motion.  Cardiovascular: Normal rate and regular rhythm.   Pulmonary/Chest: Effort normal and breath sounds normal. No respiratory distress. She has no wheezes. She has no rales.  Abdominal: Soft. Bowel sounds are normal. She exhibits no distension. There is no tenderness. There is no rebound.  Musculoskeletal: She exhibits no edema.  Neurological: She is alert and oriented to person, place, and time. Coordination normal.  Skin: Skin is warm and dry.  Psychiatric: She has a normal mood and affect.   Vitals:   03/21/16 1014  BP: 114/62  Pulse: 71  Resp: 16  Temp: 98.3 F (36.8 C)  TempSrc: Oral  SpO2: 99%  Weight: 128 lb (58.1 kg)  Height: 5\' 2"  (1.575 m)      Assessment & Plan:  Flu shot given at visit.

## 2016-03-21 NOTE — Progress Notes (Signed)
Pre visit review using our clinic review tool, if applicable. No additional management support is needed unless otherwise documented below in the visit note. 

## 2016-03-21 NOTE — Assessment & Plan Note (Signed)
Checking labs, she will start exercising. Thinks she has had tetanus in the last 10 years and will find out. Given flu shot. Counseled about BRCA screening and dangers of distracted driving. Given screening recommendations.

## 2016-03-21 NOTE — Patient Instructions (Signed)
We have given you the flu shot today.   Call the gynecologist office to see if you have had a tetanus within the last 10 years and if not come back to get one before your trip to [place].   Consider reading about BRCA mutations.   Health Maintenance, Female Introduction Adopting a healthy lifestyle and getting preventive care can go a long way to promote health and wellness. Talk with your health care provider about what schedule of regular examinations is right for you. This is a good chance for you to check in with your provider about disease prevention and staying healthy. In between checkups, there are plenty of things you can do on your own. Experts have done a lot of research about which lifestyle changes and preventive measures are most likely to keep you healthy. Ask your health care provider for more information. Weight and diet Eat a healthy diet  Be sure to include plenty of vegetables, fruits, low-fat dairy products, and lean protein.  Do not eat a lot of foods high in solid fats, added sugars, or salt.  Get regular exercise. This is one of the most important things you can do for your health.  Most adults should exercise for at least 150 minutes each week. The exercise should increase your heart rate and make you sweat (moderate-intensity exercise).  Most adults should also do strengthening exercises at least twice a week. This is in addition to the moderate-intensity exercise. Maintain a healthy weight  Body mass index (BMI) is a measurement that can be used to identify possible weight problems. It estimates body fat based on height and weight. Your health care provider can help determine your BMI and help you achieve or maintain a healthy weight.  For females 43 years of age and older:  A BMI below 18.5 is considered underweight.  A BMI of 18.5 to 24.9 is normal.  A BMI of 25 to 29.9 is considered overweight.  A BMI of 30 and above is considered obese. Watch levels of  cholesterol and blood lipids  You should start having your blood tested for lipids and cholesterol at 39 years of age, then have this test every 5 years.  You may need to have your cholesterol levels checked more often if:  Your lipid or cholesterol levels are high.  You are older than 39 years of age.  You are at high risk for heart disease. Cancer screening Lung Cancer  Lung cancer screening is recommended for adults 23-67 years old who are at high risk for lung cancer because of a history of smoking.  A yearly low-dose CT scan of the lungs is recommended for people who:  Currently smoke.  Have quit within the past 15 years.  Have at least a 30-pack-year history of smoking. A pack year is smoking an average of one pack of cigarettes a day for 1 year.  Yearly screening should continue until it has been 15 years since you quit.  Yearly screening should stop if you develop a health problem that would prevent you from having lung cancer treatment. Breast Cancer  Practice breast self-awareness. This means understanding how your breasts normally appear and feel.  It also means doing regular breast self-exams. Let your health care provider know about any changes, no matter how small.  If you are in your 20s or 30s, you should have a clinical breast exam (CBE) by a health care provider every 1-3 years as part of a regular health exam.  If you are 40 or older, have a CBE every year. Also consider having a breast X-ray (mammogram) every year.  If you have a family history of breast cancer, talk to your health care provider about genetic screening.  If you are at high risk for breast cancer, talk to your health care provider about having an MRI and a mammogram every year.  Breast cancer gene (BRCA) assessment is recommended for women who have family members with BRCA-related cancers. BRCA-related cancers include:  Breast.  Ovarian.  Tubal.  Peritoneal cancers.  Results of  the assessment will determine the need for genetic counseling and BRCA1 and BRCA2 testing. Cervical Cancer  Your health care provider may recommend that you be screened regularly for cancer of the pelvic organs (ovaries, uterus, and vagina). This screening involves a pelvic examination, including checking for microscopic changes to the surface of your cervix (Pap test). You may be encouraged to have this screening done every 3 years, beginning at age 20.  For women ages 77-65, health care providers may recommend pelvic exams and Pap testing every 3 years, or they may recommend the Pap and pelvic exam, combined with testing for human papilloma virus (HPV), every 5 years. Some types of HPV increase your risk of cervical cancer. Testing for HPV may also be done on women of any age with unclear Pap test results.  Other health care providers may not recommend any screening for nonpregnant women who are considered low risk for pelvic cancer and who do not have symptoms. Ask your health care provider if a screening pelvic exam is right for you.  If you have had past treatment for cervical cancer or a condition that could lead to cancer, you need Pap tests and screening for cancer for at least 20 years after your treatment. If Pap tests have been discontinued, your risk factors (such as having a new sexual partner) need to be reassessed to determine if screening should resume. Some women have medical problems that increase the chance of getting cervical cancer. In these cases, your health care provider may recommend more frequent screening and Pap tests. Colorectal Cancer  This type of cancer can be detected and often prevented.  Routine colorectal cancer screening usually begins at 39 years of age and continues through 39 years of age.  Your health care provider may recommend screening at an earlier age if you have risk factors for colon cancer.  Your health care provider may also recommend using home test  kits to check for hidden blood in the stool.  A small camera at the end of a tube can be used to examine your colon directly (sigmoidoscopy or colonoscopy). This is done to check for the earliest forms of colorectal cancer.  Routine screening usually begins at age 45.  Direct examination of the colon should be repeated every 5-10 years through 39 years of age. However, you may need to be screened more often if early forms of precancerous polyps or small growths are found. Skin Cancer  Check your skin from head to toe regularly.  Tell your health care provider about any new moles or changes in moles, especially if there is a change in a mole's shape or color.  Also tell your health care provider if you have a mole that is larger than the size of a pencil eraser.  Always use sunscreen. Apply sunscreen liberally and repeatedly throughout the day.  Protect yourself by wearing long sleeves, pants, a wide-brimmed hat, and sunglasses whenever  you are outside. Heart disease, diabetes, and high blood pressure  High blood pressure causes heart disease and increases the risk of stroke. High blood pressure is more likely to develop in:  People who have blood pressure in the high end of the normal range (130-139/85-89 mm Hg).  People who are overweight or obese.  People who are African American.  If you are 54-31 years of age, have your blood pressure checked every 3-5 years. If you are 36 years of age or older, have your blood pressure checked every year. You should have your blood pressure measured twice-once when you are at a hospital or clinic, and once when you are not at a hospital or clinic. Record the average of the two measurements. To check your blood pressure when you are not at a hospital or clinic, you can use:  An automated blood pressure machine at a pharmacy.  A home blood pressure monitor.  If you are between 17 years and 56 years old, ask your health care provider if you should  take aspirin to prevent strokes.  Have regular diabetes screenings. This involves taking a blood sample to check your fasting blood sugar level.  If you are at a normal weight and have a low risk for diabetes, have this test once every three years after 39 years of age.  If you are overweight and have a high risk for diabetes, consider being tested at a younger age or more often. Preventing infection Hepatitis B  If you have a higher risk for hepatitis B, you should be screened for this virus. You are considered at high risk for hepatitis B if:  You were born in a country where hepatitis B is common. Ask your health care provider which countries are considered high risk.  Your parents were born in a high-risk country, and you have not been immunized against hepatitis B (hepatitis B vaccine).  You have HIV or AIDS.  You use needles to inject street drugs.  You live with someone who has hepatitis B.  You have had sex with someone who has hepatitis B.  You get hemodialysis treatment.  You take certain medicines for conditions, including cancer, organ transplantation, and autoimmune conditions. Hepatitis C  Blood testing is recommended for:  Everyone born from [birth year] through 1964.  Anyone with known risk factors for hepatitis C. Sexually transmitted infections (STIs)  You should be screened for sexually transmitted infections (STIs) including gonorrhea and chlamydia if:  You are sexually active and are younger than 39 years of age.  You are older than 39 years of age and your health care provider tells you that you are at risk for this type of infection.  Your sexual activity has changed since you were last screened and you are at an increased risk for chlamydia or gonorrhea. Ask your health care provider if you are at risk.  If you do not have HIV, but are at risk, it may be recommended that you take a prescription medicine daily to prevent HIV infection. This is called  pre-exposure prophylaxis (PrEP). You are considered at risk if:  You are sexually active and do not regularly use condoms or know the HIV status of your partner(s).  You take drugs by injection.  You are sexually active with a partner who has HIV. Talk with your health care provider about whether you are at high risk of being infected with HIV. If you choose to begin PrEP, you should first be tested for HIV.  You should then be tested every 3 months for as long as you are taking PrEP. Pregnancy  If you are premenopausal and you may become pregnant, ask your health care provider about preconception counseling.  If you may become pregnant, take 400 to 800 micrograms (mcg) of folic acid every day.  If you want to prevent pregnancy, talk to your health care provider about birth control (contraception). Osteoporosis and menopause  Osteoporosis is a disease in which the bones lose minerals and strength with aging. This can result in serious bone fractures. Your risk for osteoporosis can be identified using a bone density scan.  If you are 33 years of age or older, or if you are at risk for osteoporosis and fractures, ask your health care provider if you should be screened.  Ask your health care provider whether you should take a calcium or vitamin D supplement to lower your risk for osteoporosis.  Menopause may have certain physical symptoms and risks.  Hormone replacement therapy may reduce some of these symptoms and risks. Talk to your health care provider about whether hormone replacement therapy is right for you. Follow these instructions at home:  Schedule regular health, dental, and eye exams.  Stay current with your immunizations.  Do not use any tobacco products including cigarettes, chewing tobacco, or electronic cigarettes.  If you are pregnant, do not drink alcohol.  If you are breastfeeding, limit how much and how often you drink alcohol.  Limit alcohol intake to no more  than 1 drink per day for nonpregnant women. One drink equals 12 ounces of beer, 5 ounces of wine, or 1 ounces of hard liquor.  Do not use street drugs.  Do not share needles.  Ask your health care provider for help if you need support or information about quitting drugs.  Tell your health care provider if you often feel depressed.  Tell your health care provider if you have ever been abused or do not feel safe at home. This information is not intended to replace advice given to you by your health care provider. Make sure you discuss any questions you have with your health care provider. Document Released: 10/30/2010 Document Revised: 09/22/2015 Document Reviewed: 01/18/2015  2017 Elsevier

## 2016-04-03 ENCOUNTER — Encounter: Payer: Self-pay | Admitting: Internal Medicine

## 2016-04-03 ENCOUNTER — Other Ambulatory Visit (INDEPENDENT_AMBULATORY_CARE_PROVIDER_SITE_OTHER): Payer: BLUE CROSS/BLUE SHIELD

## 2016-04-03 ENCOUNTER — Ambulatory Visit (INDEPENDENT_AMBULATORY_CARE_PROVIDER_SITE_OTHER): Payer: BLUE CROSS/BLUE SHIELD | Admitting: Internal Medicine

## 2016-04-03 VITALS — BP 112/62 | HR 65 | Temp 98.4°F | Resp 16 | Ht 62.0 in | Wt 127.0 lb

## 2016-04-03 DIAGNOSIS — R140 Abdominal distension (gaseous): Secondary | ICD-10-CM | POA: Diagnosis not present

## 2016-04-03 DIAGNOSIS — G479 Sleep disorder, unspecified: Secondary | ICD-10-CM | POA: Diagnosis not present

## 2016-04-03 LAB — COMPREHENSIVE METABOLIC PANEL
ALT: 11 U/L (ref 0–35)
AST: 16 U/L (ref 0–37)
Albumin: 4.3 g/dL (ref 3.5–5.2)
Alkaline Phosphatase: 66 U/L (ref 39–117)
BILIRUBIN TOTAL: 0.3 mg/dL (ref 0.2–1.2)
BUN: 9 mg/dL (ref 6–23)
CO2: 27 mEq/L (ref 19–32)
CREATININE: 0.72 mg/dL (ref 0.40–1.20)
Calcium: 9.5 mg/dL (ref 8.4–10.5)
Chloride: 105 mEq/L (ref 96–112)
GFR: 95.53 mL/min (ref >60.00)
GLUCOSE: 99 mg/dL (ref 70–99)
Potassium: 4.5 mEq/L (ref 3.5–5.1)
Sodium: 138 mEq/L (ref 135–145)
Total Protein: 7.8 g/dL (ref 6.0–8.3)

## 2016-04-03 LAB — MAGNESIUM: Magnesium: 2.1 mg/dL (ref 1.5–2.5)

## 2016-04-03 NOTE — Patient Instructions (Signed)
We will check the labs today.   You can try taking gas-x before exercising to help with gas.

## 2016-04-03 NOTE — Progress Notes (Signed)
Pre visit review using our clinic review tool, if applicable. No additional management support is needed unless otherwise documented below in the visit note. 

## 2016-04-04 DIAGNOSIS — R140 Abdominal distension (gaseous): Secondary | ICD-10-CM | POA: Insufficient documentation

## 2016-04-04 DIAGNOSIS — G479 Sleep disorder, unspecified: Secondary | ICD-10-CM | POA: Insufficient documentation

## 2016-04-04 NOTE — Assessment & Plan Note (Signed)
We discussed the possibility that during her aerobic exercise she is likely swallowing more air than normal which can cause belching and bloating. Recommended gas-x before working out and after if needed.

## 2016-04-04 NOTE — Assessment & Plan Note (Signed)
She does not want to try anything for this yet but we talked about melatonin. We will give her working out several more weeks to see if her body cycle will adapt. Checking CMP and magnesium.

## 2016-04-04 NOTE — Progress Notes (Signed)
   Subjective:    Patient ID: Shelley Long, female    DOB: [birth year], 40 y.o.   MRN: [id number]  HPI The patient is a 40 YO female coming in for some problems with working out. She started back to working out about 1 week ago. This has caused her to have gas during workout with belching and bloating. She has taken gas-x after workout with some relief. She is also having insomnia although she is working out in the early morning. This has happened in the past with working out but she was never able to pin the symptoms conclusively to this. She is concerned about being dehydrated and wants to check her labs.   Review of Systems  Constitutional: Positive for activity change. Negative for appetite change, fatigue, fever and unexpected weight change.  Respiratory: Negative.   Cardiovascular: Negative.   Gastrointestinal: Positive for abdominal distention. Negative for abdominal pain, constipation, diarrhea, nausea and vomiting.  Musculoskeletal: Negative.   Skin: Negative.   Neurological: Negative.   Psychiatric/Behavioral: Positive for sleep disturbance.      Objective:   Physical Exam  Constitutional: She is oriented to person, place, and time. She appears well-developed and well-nourished.  HENT:  Head: Normocephalic and atraumatic.  Eyes: EOM are normal.  Neck: Normal range of motion.  Cardiovascular: Normal rate and regular rhythm.   Pulmonary/Chest: Effort normal. No respiratory distress. She has no wheezes. She has no rales.  Abdominal: Soft. Bowel sounds are normal. She exhibits no distension. There is no tenderness. There is no rebound.  Musculoskeletal: She exhibits no edema.  Neurological: She is alert and oriented to person, place, and time. Coordination normal.  Skin: Skin is warm and dry.   Vitals:   04/03/16 1619  BP: 112/62  Pulse: 65  Resp: 16  Temp: 98.4 F (36.9 C)  TempSrc: Oral  SpO2: 99%  Weight: 127 lb (57.6 kg)  Height: 5\' 2"  (1.575 m)      Assessment &  Plan:

## 2016-04-05 ENCOUNTER — Ambulatory Visit: Payer: Self-pay | Admitting: Internal Medicine

## 2016-05-13 DIAGNOSIS — S99921A Unspecified injury of right foot, initial encounter: Secondary | ICD-10-CM | POA: Diagnosis not present

## 2016-05-13 DIAGNOSIS — S99911A Unspecified injury of right ankle, initial encounter: Secondary | ICD-10-CM | POA: Diagnosis not present

## 2016-05-13 DIAGNOSIS — S299XXA Unspecified injury of thorax, initial encounter: Secondary | ICD-10-CM | POA: Diagnosis not present

## 2016-06-25 ENCOUNTER — Ambulatory Visit (INDEPENDENT_AMBULATORY_CARE_PROVIDER_SITE_OTHER): Payer: BLUE CROSS/BLUE SHIELD | Admitting: Physician Assistant

## 2016-06-25 ENCOUNTER — Encounter: Payer: Self-pay | Admitting: Physician Assistant

## 2016-06-25 VITALS — BP 110/78 | HR 70 | Temp 97.7°F | Resp 14 | Ht 62.0 in | Wt 128.0 lb

## 2016-06-25 DIAGNOSIS — L500 Allergic urticaria: Secondary | ICD-10-CM | POA: Diagnosis not present

## 2016-06-25 MED ORDER — HYDROXYZINE HCL 25 MG PO TABS: 25.0000 mg | ORAL_TABLET | Freq: Three times a day (TID) | ORAL | 0 refills | Status: DC | PRN

## 2016-06-25 NOTE — Progress Notes (Signed)
Pre visit review using our clinic review tool, if applicable. No additional management support is needed unless otherwise documented below in the visit note. 

## 2016-06-25 NOTE — Progress Notes (Signed)
Patient presents to clinic today c/o 2 days of itchy rash, looking like hives. Started Sunday evening on legs and trunks. Also notes spread to arms and neck. Denies SOB, chest pain, racing heart. Denies known allergies. Recently came back from [place] visiting family. Denies contacts with similar symptoms. Denies fever, chills. Denies new pets. Endorses eating [place] bread for the first time Sunday prior to onset of rash. Also notes starting a new coconut oil lotion. Endorses some mild swelling of right lower eyelid starting about an hour ago after rubbing her lower eyelid.  Past Medical History:  Diagnosis Date  . Abdominal pain   . Delivery by vacuum extractor affecting fetus or newborn 11/30/2011  . Diastasis recti s/p primary closure/repair 11/13/2010  . Hx of ectopic pregnancy   . Normal pregnancy 11/30/2011  . Skin moles    abnormal per medical history form dated 11/13/10.    No current outpatient prescriptions on file prior to visit.   No current facility-administered medications on file prior to visit.     No Known Allergies  Family History  Problem Relation Age of Onset  . Cancer Mother     breast - stage 0  . Cancer Maternal Aunt     breast cancer  . Cancer Maternal Aunt     ovarian  . Cancer Maternal Grandmother     breast or uterine  . Colon cancer Neg Hx   . Other Neg Hx     Social History   Social History  . Marital status: Married    Spouse name: N/A  . Number of children: N/A  . Years of education: N/A   Occupational History  . CDW     Social History Main Topics  . Smoking status: Never Smoker  . Smokeless tobacco: Never Used  . Alcohol use Yes     Comment: social 1/2 drink per week; none with preg  . Drug use: No  . Sexual activity: Yes    Partners: Male   Other Topics Concern  . None   Social History Narrative   0 Caffeine drinks    Review of Systems - See HPI.  All other ROS are negative.  BP 110/78   Pulse 70   Temp 97.7 F (36.5 C)  (Oral)   Resp 14   Ht _0  (1.575 m)   Wt 128 lb (58.1 kg)   SpO2 100%   BMI 23.41 kg/m   Physical Exam  Constitutional: She is oriented to person, place, and time and well-developed, well-nourished, and in no distress.  HENT:  Head: Normocephalic and atraumatic.  Right Ear: External ear normal.  Left Ear: External ear normal.  Mouth/Throat: Oropharynx is clear and moist.  Eyes: Conjunctivae are normal.    Neck: Neck supple.  Cardiovascular: Normal rate, regular rhythm, normal heart sounds and intact distal pulses.   Pulmonary/Chest: Effort normal and breath sounds normal. No respiratory distress. She has no wheezes. She has no rales. She exhibits no tenderness.  Lymphadenopathy:    She has no cervical adenopathy.  Neurological: She is alert and oriented to person, place, and time.  Skin: Skin is warm and dry. Rash noted. Rash is urticarial.  Psychiatric: Affect normal.  Vitals reviewed.   Recent Results (from the past 2160 hour(s))  Comp Met (CMET)     Status: None   Collection Time: 04/03/16  4:44 PM  Result Value Ref Range   Sodium 138 135 - 145 mEq/L   Potassium 4.5 3.5 -  5.1 mEq/L   Chloride 105 96 - 112 mEq/L   CO2 27 19 - 32 mEq/L   Glucose, Bld 99 70 - 99 mg/dL   BUN 9 6 - 23 mg/dL   Creatinine, Ser 0.72 0.40 - 1.20 mg/dL   Total Bilirubin 0.3 0.2 - 1.2 mg/dL   Alkaline Phosphatase 66 39 - 117 U/L   AST 16 0 - 37 U/L   ALT 11 0 - 35 U/L   Total Protein 7.8 6.0 - 8.3 g/dL   Albumin 4.3 3.5 - 5.2 g/dL   Calcium 9.5 8.4 - 10.5 mg/dL   GFR 95.53 >60.00 mL/min  Magnesium     Status: None   Collection Time: 04/03/16  4:44 PM  Result Value Ref Range   Magnesium 2.1 1.5 - 2.5 mg/dL   Assessment/Plan: 1. Allergic urticaria Secondary to new lotion. Stop use. Discussed treatment options including steroid which she declines. Will start cool compresses, sarna lotion and Hydroxyzine. Supportive measures reviewed. Strict return precautions discussed with patient.  May  need allergist on board if the lotion is stopped but there is any recurrence of symptoms. Alarm signs/symptoms reviewed with patient and husband.   - hydrOXYzine (ATARAX/VISTARIL) 25 MG tablet; Take 1 tablet (25 mg total) by mouth every 8 (eight) hours as needed.  Dispense: 30 tablet; Refill: 0   Leeanne Rio, [place]

## 2016-06-25 NOTE — Patient Instructions (Signed)
Please take the Hydroxyzine as directed for itch and rash. Cool compresses will help with itch. Try over-the-counter Sarna lotion for itch.  Stop eating the new bread.  Giving our discussion, I am concerned your coconut oil/lotion is a contributor.  Please stop the lotion. If symptoms are not improving within 24 hours or anything worsens, we will need to reconsider the steroid.  If you note any windedness or swelling of tongue, please call 911 or go to the ER. At this point, this should not happen if we remove the offending agent.

## 2016-06-28 DIAGNOSIS — Z803 Family history of malignant neoplasm of breast: Secondary | ICD-10-CM | POA: Diagnosis not present

## 2016-06-28 DIAGNOSIS — Z1389 Encounter for screening for other disorder: Secondary | ICD-10-CM | POA: Diagnosis not present

## 2016-06-28 DIAGNOSIS — Z130 Encounter for screening for diseases of the blood and blood-forming organs and certain disorders involving the immune mechanism: Secondary | ICD-10-CM | POA: Diagnosis not present

## 2016-06-28 DIAGNOSIS — Z6823 Body mass index (BMI) 23.0-23.9, adult: Secondary | ICD-10-CM | POA: Diagnosis not present

## 2016-06-28 DIAGNOSIS — Z3009 Encounter for other general counseling and advice on contraception: Secondary | ICD-10-CM | POA: Diagnosis not present

## 2016-06-28 DIAGNOSIS — Z01419 Encounter for gynecological examination (general) (routine) without abnormal findings: Secondary | ICD-10-CM | POA: Diagnosis not present

## 2016-10-02 ENCOUNTER — Other Ambulatory Visit: Payer: Self-pay | Admitting: Obstetrics and Gynecology

## 2016-10-02 DIAGNOSIS — Z1231 Encounter for screening mammogram for malignant neoplasm of breast: Secondary | ICD-10-CM

## 2016-10-11 ENCOUNTER — Ambulatory Visit: Payer: BLUE CROSS/BLUE SHIELD

## 2016-10-12 ENCOUNTER — Ambulatory Visit: Payer: BLUE CROSS/BLUE SHIELD

## 2016-10-23 ENCOUNTER — Ambulatory Visit
Admission: RE | Admit: 2016-10-23 | Discharge: 2016-10-23 | Disposition: A | Discharge status: Home / Self Care | Payer: BLUE CROSS/BLUE SHIELD | Source: Ambulatory Visit | Attending: Obstetrics and Gynecology | Admitting: Obstetrics and Gynecology

## 2016-10-23 DIAGNOSIS — Z1231 Encounter for screening mammogram for malignant neoplasm of breast: Secondary | ICD-10-CM | POA: Diagnosis not present

## 2017-01-21 ENCOUNTER — Ambulatory Visit (INDEPENDENT_AMBULATORY_CARE_PROVIDER_SITE_OTHER): Payer: BLUE CROSS/BLUE SHIELD | Admitting: Internal Medicine

## 2017-01-21 ENCOUNTER — Other Ambulatory Visit: Payer: Self-pay | Admitting: Internal Medicine

## 2017-01-21 ENCOUNTER — Encounter: Payer: Self-pay | Admitting: Internal Medicine

## 2017-01-21 VITALS — BP 100/70 | HR 61 | Temp 98.5°F | Ht 62.0 in | Wt 125.0 lb

## 2017-01-21 DIAGNOSIS — Z23 Encounter for immunization: Secondary | ICD-10-CM

## 2017-01-21 DIAGNOSIS — R140 Abdominal distension (gaseous): Secondary | ICD-10-CM | POA: Diagnosis not present

## 2017-01-21 NOTE — Patient Instructions (Signed)
Work on adding more fiber into the diet or with metamucil to help with the stomach. Make sure to drink water with the fiber as well.   We are getting the ultrasound of the ovaries done to check.   Use an icepack or heating pad on the area that is sore.    High-Fiber Diet Fiber, also called dietary fiber, is a type of carbohydrate found in fruits, vegetables, whole grains, and beans. A high-fiber diet can have many health benefits. Your health care provider may recommend a high-fiber diet to help:  Prevent constipation. Fiber can make your bowel movements more regular.  Lower your cholesterol.  Relieve hemorrhoids, uncomplicated diverticulosis, or irritable bowel syndrome.  Prevent overeating as part of a weight-loss plan.  Prevent heart disease, type 2 diabetes, and certain cancers.  What is my plan? The recommended daily intake of fiber includes:  38 grams for men under age 14.  88 grams for men over age 13.  75 grams for women under age 104.  36 grams for women over age 63.  You can get the recommended daily intake of dietary fiber by eating a variety of fruits, vegetables, grains, and beans. Your health care provider may also recommend a fiber supplement if it is not possible to get enough fiber through your diet. What do I need to know about a high-fiber diet?  Fiber supplements have not been widely studied for their effectiveness, so it is better to get fiber through food sources.  Always check the fiber content on thenutrition facts label of any prepackaged food. Look for foods that contain at least 5 grams of fiber per serving.  Ask your dietitian if you have questions about specific foods that are related to your condition, especially if those foods are not listed in the following section.  Increase your daily fiber consumption gradually. Increasing your intake of dietary fiber too quickly may cause bloating, cramping, or gas.  Drink plenty of water. Water helps you  to digest fiber. What foods can I eat? Grains Whole-grain breads. Multigrain cereal. Oats and oatmeal. Brown rice. Barley. Bulgur wheat. [place]. Bran muffins. Popcorn. Rye wafer crackers. Vegetables Sweet potatoes. Spinach. Kale. Artichokes. Cabbage. Broccoli. Green peas. Carrots. Squash. Fruits Berries. Pears. Apples. Oranges. Avocados. Prunes and raisins. Dried figs. Meats and Other Protein Sources [place], kidney, pinto, and soy beans. Split peas. Lentils. Nuts and seeds. Dairy Fiber-fortified yogurt. Beverages Fiber-fortified soy milk. Fiber-fortified orange juice. Other Fiber bars. The items listed above may not be a complete list of recommended foods or beverages. Contact your dietitian for more options. What foods are not recommended? Grains White bread. Pasta made with refined flour. White rice. Vegetables Fried potatoes. Canned vegetables. Well-cooked vegetables. Fruits Fruit juice. Cooked, strained fruit. Meats and Other Protein Sources Fatty cuts of meat. Fried Sales executive or fried fish. Dairy Milk. Yogurt. Cream cheese. Sour cream. Beverages Soft drinks. Other Cakes and pastries. Butter and oils. The items listed above may not be a complete list of foods and beverages to avoid. Contact your dietitian for more information. What are some tips for including high-fiber foods in my diet?  Eat a wide variety of high-fiber foods.  Make sure that half of all grains consumed each day are whole grains.  Replace breads and cereals made from refined flour or white flour with whole-grain breads and cereals.  Replace white rice with brown rice, bulgur wheat, or millet.  Start the day with a breakfast that is high in fiber, such as a  cereal that contains at least 5 grams of fiber per serving.  Use beans in place of meat in soups, salads, or pasta.  Eat high-fiber snacks, such as berries, raw vegetables, nuts, or popcorn. This information is not intended to replace advice given  to you by your health care provider. Make sure you discuss any questions you have with your health care provider. Document Released: 04/16/2005 Document Revised: 09/22/2015 Document Reviewed: 09/29/2013 Elsevier Interactive Patient Education  2017 [employer].

## 2017-01-21 NOTE — Assessment & Plan Note (Signed)
Checking pelvic ultrasound to check the ovaries given family history. Will recommend to add more fiber to prevent constipation. If no resolution or worsening will obtain CT abdomen/pelvis with contrast to look at diverticula or other cause.

## 2017-01-21 NOTE — Progress Notes (Signed)
   Subjective:    Patient ID: Shelley Long, female    DOB: 1976-02-11, 41 y.o.   MRN: [id number]  HPI The patient is a 41 YO female coming in for stomach pain and problems. Previously was having more bloating and belching during workouts which was alleviated with gas-x. She is having about 1 week of symptoms when they start. Having some LLQ pain and bloating and constipation during flare. Pain is 5/10. Once she is able to go to the bathroom sometimes the pain alleviates. She is concerned about ovarian cancer as this runs in her family and typically early signs of cancer can be very vague. No change in diet. Denies nausea or vomiting. Some mild GERD which is different than this. No pain in the last 3 days. Not associated with her menstrual cycles that she is aware of.   Review of Systems  Constitutional: Negative.   Respiratory: Negative.   Cardiovascular: Negative.   Gastrointestinal: Positive for abdominal distention, abdominal pain and constipation. Negative for anal bleeding, blood in stool, diarrhea, nausea, rectal pain and vomiting.  Musculoskeletal: Negative.   Neurological: Negative.   Psychiatric/Behavioral: Negative.       Objective:   Physical Exam  Constitutional: She is oriented to person, place, and time. She appears well-developed and well-nourished.  HENT:  Head: Normocephalic and atraumatic.  Eyes: EOM are normal.  Neck: Normal range of motion.  Cardiovascular: Normal rate and regular rhythm.   Pulmonary/Chest: Effort normal and breath sounds normal. No respiratory distress. She has no wheezes. She has no rales.  Abdominal: Soft. Bowel sounds are normal. She exhibits no distension. There is no tenderness. There is no rebound.  Musculoskeletal: She exhibits no edema.  Neurological: She is alert and oriented to person, place, and time. Coordination normal.  Skin: Skin is warm and dry.  Psychiatric: She has a normal mood and affect.   Vitals:   01/21/17 1121  BP: 100/70    Pulse: 61  Temp: 98.5 F (36.9 C)  TempSrc: Oral  SpO2: 99%  Weight: 125 lb (56.7 kg)  Height: 5\' 2"  (1.575 m)      Assessment & Plan:  Flu shot given at visit

## 2017-01-24 ENCOUNTER — Ambulatory Visit (HOSPITAL_COMMUNITY)
Admission: RE | Admit: 2017-01-24 | Discharge: 2017-01-24 | Disposition: A | Discharge status: Home / Self Care | Payer: BLUE CROSS/BLUE SHIELD | Source: Ambulatory Visit | Attending: Internal Medicine | Admitting: Internal Medicine

## 2017-01-24 DIAGNOSIS — Z8041 Family history of malignant neoplasm of ovary: Secondary | ICD-10-CM | POA: Insufficient documentation

## 2017-01-24 DIAGNOSIS — R140 Abdominal distension (gaseous): Secondary | ICD-10-CM | POA: Diagnosis not present

## 2017-01-24 DIAGNOSIS — R938 Abnormal findings on diagnostic imaging of other specified body structures: Secondary | ICD-10-CM | POA: Diagnosis not present

## 2017-02-21 ENCOUNTER — Encounter: Payer: Self-pay | Admitting: Internal Medicine

## 2017-03-12 ENCOUNTER — Ambulatory Visit (INDEPENDENT_AMBULATORY_CARE_PROVIDER_SITE_OTHER): Payer: BLUE CROSS/BLUE SHIELD | Admitting: Internal Medicine

## 2017-03-12 ENCOUNTER — Encounter: Payer: Self-pay | Admitting: Internal Medicine

## 2017-03-12 ENCOUNTER — Other Ambulatory Visit (INDEPENDENT_AMBULATORY_CARE_PROVIDER_SITE_OTHER): Payer: BLUE CROSS/BLUE SHIELD

## 2017-03-12 VITALS — BP 100/70 | HR 55 | Temp 98.7°F | Ht 62.0 in | Wt 126.0 lb

## 2017-03-12 DIAGNOSIS — Z0000 Encounter for general adult medical examination without abnormal findings: Secondary | ICD-10-CM

## 2017-03-12 LAB — CBC
HCT: 37.5 % (ref 36.0–46.0)
Hemoglobin: 12.5 g/dL (ref 12.0–15.0)
MCHC: 33.2 g/dL (ref 30.0–36.0)
MCV: 83.8 fl (ref 78.0–100.0)
PLATELETS: 354 10*3/uL (ref 150.0–400.0)
RBC: 4.47 Mil/uL (ref 3.87–5.11)
RDW: 12.6 % (ref 11.5–15.5)
WBC: 5.8 10*3/uL (ref 4.0–10.5)

## 2017-03-12 LAB — COMPREHENSIVE METABOLIC PANEL
ALBUMIN: 4.2 g/dL (ref 3.5–5.2)
ALK PHOS: 61 U/L (ref 39–117)
ALT: 10 U/L (ref 0–35)
AST: 15 U/L (ref 0–37)
BILIRUBIN TOTAL: 0.4 mg/dL (ref 0.2–1.2)
BUN: 9 mg/dL (ref 6–23)
CO2: 26 mEq/L (ref 19–32)
CREATININE: 0.73 mg/dL (ref 0.40–1.20)
Calcium: 9.5 mg/dL (ref 8.4–10.5)
Chloride: 104 mEq/L (ref 96–112)
GFR: 93.57 mL/min (ref >60.00)
Glucose, Bld: 90 mg/dL (ref 70–99)
Potassium: 4.2 mEq/L (ref 3.5–5.1)
Sodium: 135 mEq/L (ref 135–145)
Total Protein: 7.7 g/dL (ref 6.0–8.3)

## 2017-03-12 LAB — LIPID PANEL
CHOLESTEROL: 202 mg/dL — AB (ref 0–200)
HDL: 60.9 mg/dL (ref >39.00)
LDL Cholesterol: 117 mg/dL — ABNORMAL HIGH (ref 0–99)
NonHDL: 141.19
Total CHOL/HDL Ratio: 3
Triglycerides: 121 mg/dL (ref 0.0–149.0)
VLDL: 24.2 mg/dL (ref 0.0–40.0)

## 2017-03-12 LAB — HEMOGLOBIN A1C: HEMOGLOBIN A1C: 5.5 % (ref 4.6–6.5)

## 2017-03-12 MED ORDER — HYDROCORTISONE 2.5 % RE CREA: 1.0000 "application " | TOPICAL_CREAM | Freq: Two times a day (BID) | RECTAL | 0 refills | Status: DC

## 2017-03-12 NOTE — Patient Instructions (Addendum)
We will check the labs today and have filled out the health form.  We have sent in the cream for the hemorrhoids that you use twice daily for 1-2 weeks when needed.   Health Maintenance, Female Adopting a healthy lifestyle and getting preventive care can go a long way to promote health and wellness. Talk with your health care provider about what schedule of regular examinations is right for you. This is a good chance for you to check in with your provider about disease prevention and staying healthy. In between checkups, there are plenty of things you can do on your own. Experts have done a lot of research about which lifestyle changes and preventive measures are most likely to keep you healthy. Ask your health care provider for more information. Weight and diet Eat a healthy diet  Be sure to include plenty of vegetables, fruits, low-fat dairy products, and lean protein.  Do not eat a lot of foods high in solid fats, added sugars, or salt.  Get regular exercise. This is one of the most important things you can do for your health. ? Most adults should exercise for at least 150 minutes each week. The exercise should increase your heart rate and make you sweat (moderate-intensity exercise). ? Most adults should also do strengthening exercises at least twice a week. This is in addition to the moderate-intensity exercise.  Maintain a healthy weight  Body mass index (BMI) is a measurement that can be used to identify possible weight problems. It estimates body fat based on height and weight. Your health care provider can help determine your BMI and help you achieve or maintain a healthy weight.  For females 23 years of age and older: ? A BMI below 18.5 is considered underweight. ? A BMI of 18.5 to 24.9 is normal. ? A BMI of 25 to 29.9 is considered overweight. ? A BMI of 30 and above is considered obese.  Watch levels of cholesterol and blood lipids  You should start having your blood tested  for lipids and cholesterol at 40 years of age, then have this test every 5 years.  You may need to have your cholesterol levels checked more often if: ? Your lipid or cholesterol levels are high. ? You are older than 40 years of age. ? You are at high risk for heart disease.  Cancer screening Lung Cancer  Lung cancer screening is recommended for adults 57-30 years old who are at high risk for lung cancer because of a history of smoking.  A yearly low-dose CT scan of the lungs is recommended for people who: ? Currently smoke. ? Have quit within the past 15 years. ? Have at least a 30-pack-year history of smoking. A pack year is smoking an average of one pack of cigarettes a day for 1 year.  Yearly screening should continue until it has been 15 years since you quit.  Yearly screening should stop if you develop a health problem that would prevent you from having lung cancer treatment.  Breast Cancer  Practice breast self-awareness. This means understanding how your breasts normally appear and feel.  It also means doing regular breast self-exams. Let your health care provider know about any changes, no matter how small.  If you are in your 20s or 30s, you should have a clinical breast exam (CBE) by a health care provider every 1-3 years as part of a regular health exam.  If you are 20 or older, have a CBE every year.  Also consider having a breast X-ray (mammogram) every year.  If you have a family history of breast cancer, talk to your health care provider about genetic screening.  If you are at high risk for breast cancer, talk to your health care provider about having an MRI and a mammogram every year.  Breast cancer gene (BRCA) assessment is recommended for women who have family members with BRCA-related cancers. BRCA-related cancers include: ? Breast. ? Ovarian. ? Tubal. ? Peritoneal cancers.  Results of the assessment will determine the need for genetic counseling and BRCA1  and BRCA2 testing.  Cervical Cancer Your health care provider may recommend that you be screened regularly for cancer of the pelvic organs (ovaries, uterus, and vagina). This screening involves a pelvic examination, including checking for microscopic changes to the surface of your cervix (Pap test). You may be encouraged to have this screening done every 3 years, beginning at age 79.  For women ages 84-65, health care providers may recommend pelvic exams and Pap testing every 3 years, or they may recommend the Pap and pelvic exam, combined with testing for human papilloma virus (HPV), every 5 years. Some types of HPV increase your risk of cervical cancer. Testing for HPV may also be done on women of any age with unclear Pap test results.  Other health care providers may not recommend any screening for nonpregnant women who are considered low risk for pelvic cancer and who do not have symptoms. Ask your health care provider if a screening pelvic exam is right for you.  If you have had past treatment for cervical cancer or a condition that could lead to cancer, you need Pap tests and screening for cancer for at least 20 years after your treatment. If Pap tests have been discontinued, your risk factors (such as having a new sexual partner) need to be reassessed to determine if screening should resume. Some women have medical problems that increase the chance of getting cervical cancer. In these cases, your health care provider may recommend more frequent screening and Pap tests.  Colorectal Cancer  This type of cancer can be detected and often prevented.  Routine colorectal cancer screening usually begins at 40 years of age and continues through 40 years of age.  Your health care provider may recommend screening at an earlier age if you have risk factors for colon cancer.  Your health care provider may also recommend using home test kits to check for hidden blood in the stool.  A small camera at  the end of a tube can be used to examine your colon directly (sigmoidoscopy or colonoscopy). This is done to check for the earliest forms of colorectal cancer.  Routine screening usually begins at age 52.  Direct examination of the colon should be repeated every 5-10 years through 40 years of age. However, you may need to be screened more often if early forms of precancerous polyps or small growths are found.  Skin Cancer  Check your skin from head to toe regularly.  Tell your health care provider about any new moles or changes in moles, especially if there is a change in a mole's shape or color.  Also tell your health care provider if you have a mole that is larger than the size of a pencil eraser.  Always use sunscreen. Apply sunscreen liberally and repeatedly throughout the day.  Protect yourself by wearing long sleeves, pants, a wide-brimmed hat, and sunglasses whenever you are outside.  Heart disease, diabetes, and high  blood pressure  High blood pressure causes heart disease and increases the risk of stroke. High blood pressure is more likely to develop in: ? People who have blood pressure in the high end of the normal range (130-139/85-89 mm Hg). ? People who are overweight or obese. ? People who are African American.  If you are 78-1 years of age, have your blood pressure checked every 3-5 years. If you are 71 years of age or older, have your blood pressure checked every year. You should have your blood pressure measured twice-once when you are at a hospital or clinic, and once when you are not at a hospital or clinic. Record the average of the two measurements. To check your blood pressure when you are not at a hospital or clinic, you can use: ? An automated blood pressure machine at a pharmacy. ? A home blood pressure monitor.  If you are between 55 years and 80 years old, ask your health care provider if you should take aspirin to prevent strokes.  Have regular diabetes  screenings. This involves taking a blood sample to check your fasting blood sugar level. ? If you are at a normal weight and have a low risk for diabetes, have this test once every three years after 40 years of age. ? If you are overweight and have a high risk for diabetes, consider being tested at a younger age or more often. Preventing infection Hepatitis B  If you have a higher risk for hepatitis B, you should be screened for this virus. You are considered at high risk for hepatitis B if: ? You were born in a country where hepatitis B is common. Ask your health care provider which countries are considered high risk. ? Your parents were born in a high-risk country, and you have not been immunized against hepatitis B (hepatitis B vaccine). ? You have HIV or AIDS. ? You use needles to inject street drugs. ? You live with someone who has hepatitis B. ? You have had sex with someone who has hepatitis B. ? You get hemodialysis treatment. ? You take certain medicines for conditions, including cancer, organ transplantation, and autoimmune conditions.  Hepatitis C  Blood testing is recommended for: ? Everyone born from [birth year] through 1964. ? Anyone with known risk factors for hepatitis C.  Sexually transmitted infections (STIs)  You should be screened for sexually transmitted infections (STIs) including gonorrhea and chlamydia if: ? You are sexually active and are younger than 40 years of age. ? You are older than 40 years of age and your health care provider tells you that you are at risk for this type of infection. ? Your sexual activity has changed since you were last screened and you are at an increased risk for chlamydia or gonorrhea. Ask your health care provider if you are at risk.  If you do not have HIV, but are at risk, it may be recommended that you take a prescription medicine daily to prevent HIV infection. This is called pre-exposure prophylaxis (PrEP). You are considered at risk  if: ? You are sexually active and do not regularly use condoms or know the HIV status of your partner(s). ? You take drugs by injection. ? You are sexually active with a partner who has HIV.  Talk with your health care provider about whether you are at high risk of being infected with HIV. If you choose to begin PrEP, you should first be tested for HIV. You should then be tested  every 3 months for as long as you are taking PrEP. Pregnancy  If you are premenopausal and you may become pregnant, ask your health care provider about preconception counseling.  If you may become pregnant, take 400 to 800 micrograms (mcg) of folic acid every day.  If you want to prevent pregnancy, talk to your health care provider about birth control (contraception). Osteoporosis and menopause  Osteoporosis is a disease in which the bones lose minerals and strength with aging. This can result in serious bone fractures. Your risk for osteoporosis can be identified using a bone density scan.  If you are 33 years of age or older, or if you are at risk for osteoporosis and fractures, ask your health care provider if you should be screened.  Ask your health care provider whether you should take a calcium or vitamin D supplement to lower your risk for osteoporosis.  Menopause may have certain physical symptoms and risks.  Hormone replacement therapy may reduce some of these symptoms and risks. Talk to your health care provider about whether hormone replacement therapy is right for you. Follow these instructions at home:  Schedule regular health, dental, and eye exams.  Stay current with your immunizations.  Do not use any tobacco products including cigarettes, chewing tobacco, or electronic cigarettes.  If you are pregnant, do not drink alcohol.  If you are breastfeeding, limit how much and how often you drink alcohol.  Limit alcohol intake to no more than 1 drink per day for nonpregnant women. One drink  equals 12 ounces of beer, 5 ounces of wine, or 1 ounces of hard liquor.  Do not use street drugs.  Do not share needles.  Ask your health care provider for help if you need support or information about quitting drugs.  Tell your health care provider if you often feel depressed.  Tell your health care provider if you have ever been abused or do not feel safe at home. This information is not intended to replace advice given to you by your health care provider. Make sure you discuss any questions you have with your health care provider. Document Released: 10/30/2010 Document Revised: 09/22/2015 Document Reviewed: 01/18/2015 Elsevier Interactive Patient Education  [employer].

## 2017-03-12 NOTE — Progress Notes (Signed)
   Subjective:    Patient ID: Shelley Long, female    DOB: 28-May-1975, 41 y.o.   MRN: [id number]  HPI The patient is a 41 YO female coming in for wellness. stomach issues fairly resolved at this time.  PMH, [facility], social history reviewed and updated  Review of Systems  Constitutional: Negative.   HENT: Negative.   Eyes: Negative.   Respiratory: Negative for cough, chest tightness and shortness of breath.   Cardiovascular: Negative for chest pain, palpitations and leg swelling.  Gastrointestinal: Negative for abdominal distention, abdominal pain, constipation, diarrhea, nausea and vomiting.  Musculoskeletal: Negative.   Skin: Negative.   Neurological: Negative.   Psychiatric/Behavioral: Negative.       Objective:   Physical Exam  Constitutional: She is oriented to person, place, and time. She appears well-developed and well-nourished.  HENT:  Head: Normocephalic and atraumatic.  Eyes: EOM are normal.  Neck: Normal range of motion.  Cardiovascular: Normal rate and regular rhythm.  Pulmonary/Chest: Effort normal and breath sounds normal. No respiratory distress. She has no wheezes. She has no rales.  Abdominal: Soft. Bowel sounds are normal. She exhibits no distension. There is no tenderness. There is no rebound.  Musculoskeletal: She exhibits no edema.  Neurological: She is alert and oriented to person, place, and time. Coordination normal.  Skin: Skin is warm and dry.  Psychiatric: She has a normal mood and affect.   Vitals:   03/12/17 1015  BP: 100/70  Pulse: (!) 55  Temp: 98.7 F (37.1 C)  TempSrc: Oral  SpO2: 100%  Weight: 126 lb (57.2 kg)  Height: 5\' 2"  (1.575 m)      Assessment & Plan:

## 2017-03-13 NOTE — Assessment & Plan Note (Signed)
Flu shot up to date. Declines tetanus and hiv screening. Counseled about sun safety and mole surveillance and dangers of distracted driving. Given screening recommendations.

## 2017-03-29 ENCOUNTER — Encounter: Payer: BLUE CROSS/BLUE SHIELD | Admitting: Internal Medicine

## 2017-07-08 DIAGNOSIS — Z1389 Encounter for screening for other disorder: Secondary | ICD-10-CM | POA: Diagnosis not present

## 2017-07-08 DIAGNOSIS — Z6822 Body mass index (BMI) 22.0-22.9, adult: Secondary | ICD-10-CM | POA: Diagnosis not present

## 2017-07-08 DIAGNOSIS — Z01419 Encounter for gynecological examination (general) (routine) without abnormal findings: Secondary | ICD-10-CM | POA: Diagnosis not present

## 2017-07-08 DIAGNOSIS — R3129 Other microscopic hematuria: Secondary | ICD-10-CM | POA: Diagnosis not present

## 2017-07-08 DIAGNOSIS — Z130 Encounter for screening for diseases of the blood and blood-forming organs and certain disorders involving the immune mechanism: Secondary | ICD-10-CM | POA: Diagnosis not present

## 2017-07-08 LAB — HM PAP SMEAR: HM Pap smear: NORMAL

## 2017-07-08 LAB — HM MAMMOGRAPHY

## 2017-07-09 ENCOUNTER — Encounter: Payer: Self-pay | Admitting: Internal Medicine

## 2017-07-09 DIAGNOSIS — R3129 Other microscopic hematuria: Secondary | ICD-10-CM | POA: Diagnosis not present

## 2017-07-09 NOTE — Progress Notes (Signed)
Result abstracted and sent to scan ° °

## 2017-09-06 DIAGNOSIS — H1013 Acute atopic conjunctivitis, bilateral: Secondary | ICD-10-CM | POA: Diagnosis not present

## 2018-02-24 ENCOUNTER — Encounter: Payer: BLUE CROSS/BLUE SHIELD | Admitting: Internal Medicine

## 2018-02-24 DIAGNOSIS — Z0289 Encounter for other administrative examinations: Secondary | ICD-10-CM

## 2018-02-26 ENCOUNTER — Encounter: Payer: Self-pay | Admitting: Internal Medicine

## 2018-02-26 ENCOUNTER — Ambulatory Visit (INDEPENDENT_AMBULATORY_CARE_PROVIDER_SITE_OTHER): Payer: BLUE CROSS/BLUE SHIELD | Admitting: Internal Medicine

## 2018-03-05 ENCOUNTER — Other Ambulatory Visit: Payer: Self-pay | Admitting: Internal Medicine

## 2018-03-05 DIAGNOSIS — Z1231 Encounter for screening mammogram for malignant neoplasm of breast: Secondary | ICD-10-CM

## 2018-03-13 ENCOUNTER — Encounter: Payer: BLUE CROSS/BLUE SHIELD | Admitting: Internal Medicine

## 2018-03-14 ENCOUNTER — Other Ambulatory Visit: Payer: Self-pay | Admitting: Internal Medicine

## 2018-03-14 ENCOUNTER — Ambulatory Visit (INDEPENDENT_AMBULATORY_CARE_PROVIDER_SITE_OTHER): Payer: BLUE CROSS/BLUE SHIELD | Admitting: Internal Medicine

## 2018-03-14 ENCOUNTER — Other Ambulatory Visit (INDEPENDENT_AMBULATORY_CARE_PROVIDER_SITE_OTHER): Payer: BLUE CROSS/BLUE SHIELD

## 2018-03-14 ENCOUNTER — Encounter: Payer: Self-pay | Admitting: Internal Medicine

## 2018-03-14 VITALS — BP 94/60 | HR 77 | Temp 97.7°F | Ht 62.0 in | Wt 127.0 lb

## 2018-03-14 DIAGNOSIS — Z0000 Encounter for general adult medical examination without abnormal findings: Secondary | ICD-10-CM | POA: Diagnosis not present

## 2018-03-14 DIAGNOSIS — E559 Vitamin D deficiency, unspecified: Secondary | ICD-10-CM

## 2018-03-14 DIAGNOSIS — Z23 Encounter for immunization: Secondary | ICD-10-CM | POA: Diagnosis not present

## 2018-03-14 LAB — COMPREHENSIVE METABOLIC PANEL
ALBUMIN: 4.2 g/dL (ref 3.5–5.2)
ALT: 10 U/L (ref 0–35)
AST: 17 U/L (ref 0–37)
Alkaline Phosphatase: 65 U/L (ref 39–117)
BUN: 9 mg/dL (ref 6–23)
CO2: 28 meq/L (ref 19–32)
CREATININE: 0.81 mg/dL (ref 0.40–1.20)
Calcium: 9.5 mg/dL (ref 8.4–10.5)
Chloride: 103 mEq/L (ref 96–112)
GFR: 82.58 mL/min (ref >60.00)
Glucose, Bld: 93 mg/dL (ref 70–99)
POTASSIUM: 4.2 meq/L (ref 3.5–5.1)
SODIUM: 138 meq/L (ref 135–145)
Total Bilirubin: 0.3 mg/dL (ref 0.2–1.2)
Total Protein: 7.5 g/dL (ref 6.0–8.3)

## 2018-03-14 LAB — CBC
HEMATOCRIT: 36.8 % (ref 36.0–46.0)
HEMOGLOBIN: 12.6 g/dL (ref 12.0–15.0)
MCHC: 34.2 g/dL (ref 30.0–36.0)
MCV: 82.1 fl (ref 78.0–100.0)
PLATELETS: 356 10*3/uL (ref 150.0–400.0)
RBC: 4.48 Mil/uL (ref 3.87–5.11)
RDW: 12.5 % (ref 11.5–15.5)
WBC: 5.8 10*3/uL (ref 4.0–10.5)

## 2018-03-14 LAB — LIPID PANEL
CHOL/HDL RATIO: 3
Cholesterol: 197 mg/dL (ref 0–200)
HDL: 59.9 mg/dL (ref >39.00)
LDL CALC: 117 mg/dL — AB (ref 0–99)
NonHDL: 137.57
Triglycerides: 103 mg/dL (ref 0.0–149.0)
VLDL: 20.6 mg/dL (ref 0.0–40.0)

## 2018-03-14 LAB — FOLLICLE STIMULATING HORMONE: FSH: 4.5 m[IU]/mL

## 2018-03-14 LAB — VITAMIN B12: VITAMIN B 12: 271 pg/mL (ref 211–911)

## 2018-03-14 LAB — VITAMIN D 25 HYDROXY (VIT D DEFICIENCY, FRACTURES): VITD: 11.19 ng/mL — AB (ref 30.00–100.00)

## 2018-03-14 LAB — TSH: TSH: 1.6 u[IU]/mL (ref 0.35–4.50)

## 2018-03-14 MED ORDER — VITAMIN D (ERGOCALCIFEROL) 1.25 MG (50000 UNIT) PO CAPS: 50000.0000 [IU] | ORAL_CAPSULE | ORAL | 0 refills | Status: DC

## 2018-03-14 MED ORDER — TYPHOID VACCINE PO CPDR: 1.0000 | DELAYED_RELEASE_CAPSULE | ORAL | 0 refills | Status: DC

## 2018-03-14 NOTE — Assessment & Plan Note (Signed)
Checking vitamin D and adjust as needed. 

## 2018-03-14 NOTE — Progress Notes (Signed)
   Subjective:    Patient ID: Shelley Long, female    DOB: 1975-06-04, 42 y.o.   MRN: [id number]  HPI The patient is a 42 YO female coming in for physical.   PMH, [facility], social history reviewed and updated.   Review of Systems  Constitutional: Negative.   HENT: Negative.   Eyes: Negative.   Respiratory: Negative for cough, chest tightness and shortness of breath.   Cardiovascular: Negative for chest pain, palpitations and leg swelling.  Gastrointestinal: Negative for abdominal distention, abdominal pain, constipation, diarrhea, nausea and vomiting.  Musculoskeletal: Negative.   Skin: Negative.   Neurological: Negative.   Psychiatric/Behavioral: Negative.       Objective:   Physical Exam  Constitutional: She is oriented to person, place, and time. She appears well-developed and well-nourished.  HENT:  Head: Normocephalic and atraumatic.  Eyes: EOM are normal.  Neck: Normal range of motion.  Cardiovascular: Normal rate and regular rhythm.  Pulmonary/Chest: Effort normal and breath sounds normal. No respiratory distress. She has no wheezes. She has no rales.  Abdominal: Soft. Bowel sounds are normal. She exhibits no distension. There is no tenderness. There is no rebound.  Musculoskeletal: She exhibits no edema.  Neurological: She is alert and oriented to person, place, and time. Coordination normal.  Skin: Skin is warm and dry.  Psychiatric: She has a normal mood and affect.   Vitals:   03/14/18 0809  BP: 94/60  Pulse: 77  Temp: 97.7 F (36.5 C)  TempSrc: Oral  SpO2: 99%  Weight: 127 lb (57.6 kg)  Height: 5\' 2"  (1.575 m)      Assessment & Plan:  Flu shot given at visit

## 2018-03-14 NOTE — Patient Instructions (Addendum)
We have sent in the typhoid vaccine pills. Take 1 every other day for the 4 pills. You have to finish this at least 1 week prior to travel.   Health Maintenance, Female Adopting a healthy lifestyle and getting preventive care can go a long way to promote health and wellness. Talk with your health care provider about what schedule of regular examinations is right for you. This is a good chance for you to check in with your provider about disease prevention and staying healthy. In between checkups, there are plenty of things you can do on your own. Experts have done a lot of research about which lifestyle changes and preventive measures are most likely to keep you healthy. Ask your health care provider for more information. Weight and diet Eat a healthy diet  Be sure to include plenty of vegetables, fruits, low-fat dairy products, and lean protein.  Do not eat a lot of foods high in solid fats, added sugars, or salt.  Get regular exercise. This is one of the most important things you can do for your health. ? Most adults should exercise for at least 150 minutes each week. The exercise should increase your heart rate and make you sweat (moderate-intensity exercise). ? Most adults should also do strengthening exercises at least twice a week. This is in addition to the moderate-intensity exercise.  Maintain a healthy weight  Body mass index (BMI) is a measurement that can be used to identify possible weight problems. It estimates body fat based on height and weight. Your health care provider can help determine your BMI and help you achieve or maintain a healthy weight.  For females 35 years of age and older: ? A BMI below 18.5 is considered underweight. ? A BMI of 18.5 to 24.9 is normal. ? A BMI of 25 to 29.9 is considered overweight. ? A BMI of 30 and above is considered obese.  Watch levels of cholesterol and blood lipids  You should start having your blood tested for lipids and cholesterol  at 41 years of age, then have this test every 5 years.  You may need to have your cholesterol levels checked more often if: ? Your lipid or cholesterol levels are high. ? You are older than 41 years of age. ? You are at high risk for heart disease.  Cancer screening Lung Cancer  Lung cancer screening is recommended for adults 62-52 years old who are at high risk for lung cancer because of a history of smoking.  A yearly low-dose CT scan of the lungs is recommended for people who: ? Currently smoke. ? Have quit within the past 15 years. ? Have at least a 30-pack-year history of smoking. A pack year is smoking an average of one pack of cigarettes a day for 1 year.  Yearly screening should continue until it has been 15 years since you quit.  Yearly screening should stop if you develop a health problem that would prevent you from having lung cancer treatment.  Breast Cancer  Practice breast self-awareness. This means understanding how your breasts normally appear and feel.  It also means doing regular breast self-exams. Let your health care provider know about any changes, no matter how small.  If you are in your 20s or 30s, you should have a clinical breast exam (CBE) by a health care provider every 1-3 years as part of a regular health exam.  If you are 30 or older, have a CBE every year. Also consider having a  breast X-ray (mammogram) every year.  If you have a family history of breast cancer, talk to your health care provider about genetic screening.  If you are at high risk for breast cancer, talk to your health care provider about having an MRI and a mammogram every year.  Breast cancer gene (BRCA) assessment is recommended for women who have family members with BRCA-related cancers. BRCA-related cancers include: ? Breast. ? Ovarian. ? Tubal. ? Peritoneal cancers.  Results of the assessment will determine the need for genetic counseling and BRCA1 and BRCA2  testing.  Cervical Cancer Your health care provider may recommend that you be screened regularly for cancer of the pelvic organs (ovaries, uterus, and vagina). This screening involves a pelvic examination, including checking for microscopic changes to the surface of your cervix (Pap test). You may be encouraged to have this screening done every 3 years, beginning at age 56.  For women ages 52-65, health care providers may recommend pelvic exams and Pap testing every 3 years, or they may recommend the Pap and pelvic exam, combined with testing for human papilloma virus (HPV), every 5 years. Some types of HPV increase your risk of cervical cancer. Testing for HPV may also be done on women of any age with unclear Pap test results.  Other health care providers may not recommend any screening for nonpregnant women who are considered low risk for pelvic cancer and who do not have symptoms. Ask your health care provider if a screening pelvic exam is right for you.  If you have had past treatment for cervical cancer or a condition that could lead to cancer, you need Pap tests and screening for cancer for at least 20 years after your treatment. If Pap tests have been discontinued, your risk factors (such as having a new sexual partner) need to be reassessed to determine if screening should resume. Some women have medical problems that increase the chance of getting cervical cancer. In these cases, your health care provider may recommend more frequent screening and Pap tests.  Colorectal Cancer  This type of cancer can be detected and often prevented.  Routine colorectal cancer screening usually begins at 41 years of age and continues through 41 years of age.  Your health care provider may recommend screening at an earlier age if you have risk factors for colon cancer.  Your health care provider may also recommend using home test kits to check for hidden blood in the stool.  A small camera at the end of a  tube can be used to examine your colon directly (sigmoidoscopy or colonoscopy). This is done to check for the earliest forms of colorectal cancer.  Routine screening usually begins at age 82.  Direct examination of the colon should be repeated every 5-10 years through 41 years of age. However, you may need to be screened more often if early forms of precancerous polyps or small growths are found.  Skin Cancer  Check your skin from head to toe regularly.  Tell your health care provider about any new moles or changes in moles, especially if there is a change in a mole's shape or color.  Also tell your health care provider if you have a mole that is larger than the size of a pencil eraser.  Always use sunscreen. Apply sunscreen liberally and repeatedly throughout the day.  Protect yourself by wearing long sleeves, pants, a wide-brimmed hat, and sunglasses whenever you are outside.  Heart disease, diabetes, and high blood pressure  High  blood pressure causes heart disease and increases the risk of stroke. High blood pressure is more likely to develop in: ? People who have blood pressure in the high end of the normal range (130-139/85-89 mm Hg). ? People who are overweight or obese. ? People who are African American.  If you are 80-3 years of age, have your blood pressure checked every 3-5 years. If you are 28 years of age or older, have your blood pressure checked every year. You should have your blood pressure measured twice-once when you are at a hospital or clinic, and once when you are not at a hospital or clinic. Record the average of the two measurements. To check your blood pressure when you are not at a hospital or clinic, you can use: ? An automated blood pressure machine at a pharmacy. ? A home blood pressure monitor.  If you are between 14 years and 15 years old, ask your health care provider if you should take aspirin to prevent strokes.  Have regular diabetes screenings. This  involves taking a blood sample to check your fasting blood sugar level. ? If you are at a normal weight and have a low risk for diabetes, have this test once every three years after 41 years of age. ? If you are overweight and have a high risk for diabetes, consider being tested at a younger age or more often. Preventing infection Hepatitis B  If you have a higher risk for hepatitis B, you should be screened for this virus. You are considered at high risk for hepatitis B if: ? You were born in a country where hepatitis B is common. Ask your health care provider which countries are considered high risk. ? Your parents were born in a high-risk country, and you have not been immunized against hepatitis B (hepatitis B vaccine). ? You have HIV or AIDS. ? You use needles to inject street drugs. ? You live with someone who has hepatitis B. ? You have had sex with someone who has hepatitis B. ? You get hemodialysis treatment. ? You take certain medicines for conditions, including cancer, organ transplantation, and autoimmune conditions.  Hepatitis C  Blood testing is recommended for: ? Everyone born from [birth year] through 1964. ? Anyone with known risk factors for hepatitis C.  Sexually transmitted infections (STIs)  You should be screened for sexually transmitted infections (STIs) including gonorrhea and chlamydia if: ? You are sexually active and are younger than 41 years of age. ? You are older than 41 years of age and your health care provider tells you that you are at risk for this type of infection. ? Your sexual activity has changed since you were last screened and you are at an increased risk for chlamydia or gonorrhea. Ask your health care provider if you are at risk.  If you do not have HIV, but are at risk, it may be recommended that you take a prescription medicine daily to prevent HIV infection. This is called pre-exposure prophylaxis (PrEP). You are considered at risk if: ? You are  sexually active and do not regularly use condoms or know the HIV status of your partner(s). ? You take drugs by injection. ? You are sexually active with a partner who has HIV.  Talk with your health care provider about whether you are at high risk of being infected with HIV. If you choose to begin PrEP, you should first be tested for HIV. You should then be tested every 3 months for  as long as you are taking PrEP. Pregnancy  If you are premenopausal and you may become pregnant, ask your health care provider about preconception counseling.  If you may become pregnant, take 400 to 800 micrograms (mcg) of folic acid every day.  If you want to prevent pregnancy, talk to your health care provider about birth control (contraception). Osteoporosis and menopause  Osteoporosis is a disease in which the bones lose minerals and strength with aging. This can result in serious bone fractures. Your risk for osteoporosis can be identified using a bone density scan.  If you are 70 years of age or older, or if you are at risk for osteoporosis and fractures, ask your health care provider if you should be screened.  Ask your health care provider whether you should take a calcium or vitamin D supplement to lower your risk for osteoporosis.  Menopause may have certain physical symptoms and risks.  Hormone replacement therapy may reduce some of these symptoms and risks. Talk to your health care provider about whether hormone replacement therapy is right for you. Follow these instructions at home:  Schedule regular health, dental, and eye exams.  Stay current with your immunizations.  Do not use any tobacco products including cigarettes, chewing tobacco, or electronic cigarettes.  If you are pregnant, do not drink alcohol.  If you are breastfeeding, limit how much and how often you drink alcohol.  Limit alcohol intake to no more than 1 drink per day for nonpregnant women. One drink equals 12 ounces of  beer, 5 ounces of wine, or 1 ounces of hard liquor.  Do not use street drugs.  Do not share needles.  Ask your health care provider for help if you need support or information about quitting drugs.  Tell your health care provider if you often feel depressed.  Tell your health care provider if you have ever been abused or do not feel safe at home. This information is not intended to replace advice given to you by your health care provider. Make sure you discuss any questions you have with your health care provider. Document Released: 10/30/2010 Document Revised: 09/22/2015 Document Reviewed: 01/18/2015 Elsevier Interactive Patient Education  [employer].

## 2018-03-14 NOTE — Assessment & Plan Note (Signed)
Flu shot given. Tetanus up to date. Mammogram up to date, pap smear up to date. Counseled about sun safety and mole surveillance. Counseled about the dangers of distracted driving. Given 10 year screening recommendations.

## 2018-03-18 ENCOUNTER — Telehealth: Payer: Self-pay | Admitting: Internal Medicine

## 2018-03-18 NOTE — Telephone Encounter (Signed)
Copied from [place] [id number]. Topic: General - Other >> Mar 18, 2018 12:04 PM Lennox Solders wrote: Reason for CRM: pt no longer uses [facility] and vit d rx needs to go to [facility]. Pt does not want to call [facility] to have it transferred to [facility].

## 2018-03-19 ENCOUNTER — Ambulatory Visit: Payer: Self-pay | Admitting: *Deleted

## 2018-03-19 ENCOUNTER — Other Ambulatory Visit: Payer: Self-pay | Admitting: *Deleted

## 2018-03-19 MED ORDER — VITAMIN D (ERGOCALCIFEROL) 1.25 MG (50000 UNIT) PO CAPS: 50000.0000 [IU] | ORAL_CAPSULE | ORAL | 0 refills | Status: DC

## 2018-03-19 NOTE — Telephone Encounter (Signed)
Patient reports having elevated temp since Monday evening. Today it is 102.3 oral. She has a sore throat and achy shoulders also. Reports her child was sick last week with fever and was treated with abx. She is requesting appointment.  Advice care reviewed included alternating advil and tylenol, heat for her achy shoulders. Use lysol wipes to prevent any others in the home from getting sick. Appointment made by agent for tomorrow.  Reason for Disposition . [1] Fever AND [2] no signs of serious infection or localizing symptoms (all other triage questions negative)  Answer Assessment - Initial Assessment Questions 1. TEMPERATURE: "What is the most recent temperature?"  "How was it measured?"      102.3 oral  2. ONSET: "When did the fever start?"      Monday night.  3. SYMPTOMS: "Do you have any other symptoms besides the fever?"  (e.g., colds, headache, sore throat, earache, cough, rash, diarrhea, vomiting, abdominal pain)     Sore throat. Achy shoulders 4. CAUSE: If there are no symptoms, ask: "What do you think is causing the fever?"      Her child had similar symptoms last week and was treated with antibiotic. 5. CONTACTS: "Does anyone else in the family have an infection?"     Her child was sick last week. 6. TREATMENT: "What have you done so far to treat this fever?" (e.g., medications)     Advil and tylenol 7. IMMUNOCOMPROMISE: "Do you have of the following: diabetes, HIV positive, splenectomy, cancer chemotherapy, chronic steroid treatment, transplant patient, etc."     no 8. PREGNANCY: "Is there any chance you are pregnant?" "When was your last menstrual period?"     no 9. TRAVEL: "Have you traveled out of the country in the last month?" (e.g., travel history, exposures)  Protocols used: FEVER-A-AH

## 2018-03-19 NOTE — Telephone Encounter (Signed)
Rx sent to new pharmacy per patient request

## 2018-03-20 ENCOUNTER — Encounter: Payer: Self-pay | Admitting: Internal Medicine

## 2018-03-20 ENCOUNTER — Ambulatory Visit (INDEPENDENT_AMBULATORY_CARE_PROVIDER_SITE_OTHER): Payer: BLUE CROSS/BLUE SHIELD | Admitting: Internal Medicine

## 2018-03-20 VITALS — BP 90/60 | HR 81 | Temp 98.2°F | Ht 62.0 in

## 2018-03-20 DIAGNOSIS — R6889 Other general symptoms and signs: Secondary | ICD-10-CM | POA: Diagnosis not present

## 2018-03-20 LAB — POC INFLUENZA A&B (BINAX/QUICKVUE)
INFLUENZA B, POC: NEGATIVE
Influenza A, POC: NEGATIVE

## 2018-03-20 NOTE — Progress Notes (Signed)
   Subjective:    Patient ID: Shelley Long, female    DOB: 1975/06/19, 42 y.o.   MRN: [id number]  HPI The patient is a 42 YO female coming in for 2-3 days of fevers (102.74F) and sore throat with sinus symptoms. She is also having a lot of muscle aches. Her son was sick last week and treated with antibiotics. She states they told her it was likely viral. She has been taking ibuprofen and tylenol which has been keeping the fever down today. She is feeling sick and feeling it come back now. She is having some headaches as well. Is overall stable since onset. Taking otc lozenges which are helping for a short amount of time. Denies cough or SOB. Denies confusion.   Review of Systems  Constitutional: Positive for activity change, appetite change, chills, fatigue and fever. Negative for unexpected weight change.  HENT: Positive for congestion, postnasal drip, rhinorrhea, sinus pressure and sore throat. Negative for ear discharge, ear pain, sinus pain, sneezing, tinnitus, trouble swallowing and voice change.   Eyes: Negative.   Respiratory: Negative for cough, chest tightness, shortness of breath and wheezing.   Cardiovascular: Negative.   Gastrointestinal: Negative.   Musculoskeletal: Positive for myalgias.  Neurological: Positive for headaches.      Objective:   Physical Exam  Constitutional: She is oriented to person, place, and time. She appears well-developed and well-nourished.  HENT:  Head: Normocephalic and atraumatic.  Oropharynx with redness and clear drainage, nose with swollen turbinates, TMs normal bilaterally  Eyes: EOM are normal.  Neck: Normal range of motion. No thyromegaly present.  No neck stiffness  Cardiovascular: Normal rate and regular rhythm.  Pulmonary/Chest: Effort normal and breath sounds normal. No respiratory distress. She has no wheezes. She has no rales.  Abdominal: Soft.  Musculoskeletal: She exhibits tenderness.  Lymphadenopathy:    She has no cervical  adenopathy.  Neurological: She is alert and oriented to person, place, and time.  Skin: Skin is warm and dry.   Vitals:   03/20/18 0815  BP: 90/60  Pulse: 81  Temp: 98.2 F (36.8 C)  TempSrc: Oral  SpO2: 99%  Height: 5\' 2"  (1.575 m)      Assessment & Plan:

## 2018-03-20 NOTE — Assessment & Plan Note (Signed)
POC flu test negative in the office. Advised conservative treatment. If no improvement by Monday will call in antibiotics. Advised to start taking zyrtec and can use cold medicine otc if helpful.

## 2018-03-20 NOTE — Patient Instructions (Addendum)
This is likely a viral illness. Try starting zyrtec daily to help with drainage to help you feel better.   The flu test is negative today.   Usually viral infections last about 7-10 days and the worst of symptoms typically occur around day 5.    Upper Respiratory Infection, Adult Most upper respiratory infections (URIs) are caused by a virus. A URI affects the nose, throat, and upper air passages. The most common type of URI is often called "the common cold." Follow these instructions at home:  Take medicines only as told by your doctor.  Gargle warm saltwater or take cough drops to comfort your throat as told by your doctor.  Use a warm mist humidifier or inhale steam from a shower to increase air moisture. This may make it easier to breathe.  Drink enough fluid to keep your pee (urine) clear or pale yellow.  Eat soups and other clear broths.  Have a healthy diet.  Rest as needed.  Go back to work when your fever is gone or your doctor says it is okay. ? You may need to stay home longer to avoid giving your URI to others. ? You can also wear a face mask and wash your hands often to prevent spread of the virus.  Use your inhaler more if you have asthma.  Do not use any tobacco products, including cigarettes, chewing tobacco, or electronic cigarettes. If you need help quitting, ask your doctor. Contact a doctor if:  You are getting worse, not better.  Your symptoms are not helped by medicine.  You have chills.  You are getting more short of breath.  You have brown or red mucus.  You have yellow or brown discharge from your nose.  You have pain in your face, especially when you bend forward.  You have a fever.  You have puffy (swollen) neck glands.  You have pain while swallowing.  You have white areas in the back of your throat. Get help right away if:  You have very bad or constant: ? Headache. ? Ear pain. ? Pain in your forehead, behind your eyes, and over  your cheekbones (sinus pain). ? Chest pain.  You have long-lasting (chronic) lung disease and any of the following: ? Wheezing. ? Long-lasting cough. ? Coughing up blood. ? A change in your usual mucus.  You have a stiff neck.  You have changes in your: ? Vision. ? Hearing. ? Thinking. ? Mood. This information is not intended to replace advice given to you by your health care provider. Make sure you discuss any questions you have with your health care provider. Document Released: 10/03/2007 Document Revised: 12/18/2015 Document Reviewed: 07/22/2013 Elsevier Interactive Patient Education  2018 [employer].

## 2018-03-31 ENCOUNTER — Ambulatory Visit: Payer: BLUE CROSS/BLUE SHIELD | Admitting: Internal Medicine

## 2018-03-31 NOTE — Progress Notes (Signed)
Subjective:    Patient ID: Shelley Long, female    DOB: 1975/09/06, 42 y.o.   MRN: [id number]  HPI The patient is here for an acute visit.   Swollen left eye lid x 5  Days ago.  She was eating dinner and her left upper eyelid itched and she scratched her eye lid and it swelled up.  The swelling has gone down significantly since then, but it is not 100% normal-still slightly swollen.  That night she did take Benadryl, but she has not taken any since then.  2 nights ago it was very red and itchy, but that also improved.  2 nights ago she also had a mild sore throat.  She had no fever, chills, coughing, wheezing or shortness of breath through all of this.  Initially she denied any prior episodes, but then recalled that she did have an episode of lip swelling a while ago.  She denies any obvious causes for either episode.  She denies any family history of swelling episodes.  She denies any known allergies, especially food allergies.     She has had shoulder and neck pain for 2 weeks ago.  She took advil and thought it would go away.  It gets worse over night and then improves a little during the day.  Her right arm feels tingling, slightly numb and cool.  She was unsure if the symptoms were related to her eye symptoms.   Medications and allergies reviewed with patient and updated if appropriate.  Patient Active Problem List   Diagnosis Date Noted  . Flu-like symptoms 03/20/2018  . Sleeping difficulties 04/04/2016  . Routine general medical examination at a health care facility 10/08/2014  . Vitamin D deficiency 09/24/2013  . Pancreatic cyst 12/19/2010  . Family history of breast cancer in first degree relative 12/19/2010    Current Outpatient Medications on File Prior to Visit  Medication Sig Dispense Refill  . typhoid (VIVOTIF) DR capsule Take 1 capsule by mouth every other day. 4 capsule 0  . Vitamin D, Ergocalciferol, (DRISDOL) 1.25 MG (50000 UT) CAPS capsule Take 1 capsule (50,000  Units total) by mouth every 7 (seven) days. 12 capsule 0   No current facility-administered medications on file prior to visit.     Past Medical History:  Diagnosis Date  . Abdominal pain   . Delivery by vacuum extractor affecting fetus or newborn 11/30/2011  . Diastasis recti s/p primary closure/repair 11/13/2010  . Hx of ectopic pregnancy   . Normal pregnancy 11/30/2011  . Skin moles    abnormal per medical history form dated 11/13/10.    Past Surgical History:  Procedure Laterality Date  . CESAREAN SECTION     x 1   . ECTOPIC PREGNANCY SURGERY  2010  . HERNIA REPAIR  04/02/12   lap ventral hernia repair  . VENTRAL HERNIA REPAIR  04/02/2012    Social History   Socioeconomic History  . Marital status: Married    Spouse name: Not on file  . Number of children: Not on file  . Years of education: Not on file  . Highest education level: Not on file  Occupational History  . Occupation: CDW   Social Needs  . Financial resource strain: Not on file  . Food insecurity:    Worry: Not on file    Inability: Not on file  . Transportation needs:    Medical: Not on file    Non-medical: Not on file  Tobacco Use  .  Smoking status: Never Smoker  . Smokeless tobacco: Never Used  Substance and Sexual Activity  . Alcohol use: Yes    Comment: social 1/2 drink per week; none with preg  . Drug use: No  . Sexual activity: Yes    Partners: Male  Lifestyle  . Physical activity:    Days per week: Not on file    Minutes per session: Not on file  . Stress: Not on file  Relationships  . Social connections:    Talks on phone: Not on file    Gets together: Not on file    Attends religious service: Not on file    Active member of club or organization: Not on file    Attends meetings of clubs or organizations: Not on file    Relationship status: Not on file  Other Topics Concern  . Not on file  Social History Narrative   0 Caffeine drinks     Family History  Problem Relation Age of  Onset  . Cancer Mother        breast - stage 0  . Breast cancer Mother   . Cancer Maternal Aunt        breast cancer  . Breast cancer Maternal Aunt   . Cancer Maternal Aunt        ovarian  . Breast cancer Maternal Aunt   . Cancer Maternal Grandmother        breast or uterine  . Colon cancer Neg Hx   . Other Neg Hx     Review of Systems  Constitutional: Negative for chills and fever.  HENT: Positive for sore throat. Negative for congestion, ear pain and sinus pain.   Eyes: Positive for redness (top eye lid) and itching. Negative for visual disturbance (blurry initially).  Respiratory: Negative for cough, shortness of breath and wheezing.   Musculoskeletal: Positive for neck pain and neck stiffness.  Neurological: Positive for headaches (? from menses). Negative for light-headedness.       Objective:   Vitals:   04/01/18 1420  BP: 104/70  Pulse: 78  Resp: 16  Temp: 97.8 F (36.6 C)  SpO2: 98%   BP Readings from Last 3 Encounters:  04/01/18 104/70  03/20/18 90/60  03/14/18 94/60   Wt Readings from Last 3 Encounters:  04/01/18 126 lb (57.2 kg)  03/14/18 127 lb (57.6 kg)  03/12/17 126 lb (57.2 kg)   Body mass index is 23.05 kg/m.   Physical Exam  Constitutional: She appears well-developed and well-nourished. No distress.  HENT:  Head: Normocephalic and atraumatic.  Right Ear: External ear normal.  Left Ear: External ear normal.  Mouth/Throat: Oropharynx is clear and moist.  Eyes: Pupils are equal, round, and reactive to light. Conjunctivae and EOM are normal. Right eye exhibits no discharge. Left eye exhibits no discharge. No scleral icterus.  Left upper eyelid with minimal swelling and erythema, no lower eyelid swelling or erythema  Cardiovascular: Normal rate and regular rhythm.  Pulmonary/Chest: Effort normal and breath sounds normal. No respiratory distress. She has no wheezes. She has no rales.  Musculoskeletal: Normal range of motion. She exhibits  tenderness (Neck and upper back into middle back-along trapezius distribution). She exhibits no edema.  Neurological:  Normal sensation and strength bilateral upper extremities  Skin: Skin is warm and dry. She is not diaphoretic.           Assessment & Plan:    See Problem List for Assessment and Plan of chronic medical problems.

## 2018-04-01 ENCOUNTER — Ambulatory Visit (INDEPENDENT_AMBULATORY_CARE_PROVIDER_SITE_OTHER): Payer: BLUE CROSS/BLUE SHIELD | Admitting: Internal Medicine

## 2018-04-01 ENCOUNTER — Encounter: Payer: Self-pay | Admitting: Internal Medicine

## 2018-04-01 VITALS — BP 104/70 | HR 78 | Temp 97.8°F | Resp 16 | Ht 62.0 in | Wt 126.0 lb

## 2018-04-01 DIAGNOSIS — M542 Cervicalgia: Secondary | ICD-10-CM | POA: Diagnosis not present

## 2018-04-01 DIAGNOSIS — H02846 Edema of left eye, unspecified eyelid: Secondary | ICD-10-CM | POA: Insufficient documentation

## 2018-04-01 NOTE — Assessment & Plan Note (Signed)
Possible angioedema She does think she had one other episode of lip swelling, but initially did not recall that She denies any obvious bug bites or anything else that may have caused the eyelid swelling Discussed seeing an allergist, but she would like to just monitor for now Advised taking Zyrtec nightly for the next 2 weeks Discussed keeping Benadryl on hand at all times Advised her to follow-up if she does not have another episode-should have further evaluation at that time

## 2018-04-01 NOTE — Assessment & Plan Note (Signed)
Neck pain is muscular in nature Advise NSAIDs such as Aleve or Advil, heat, gentle stretching Discussed avoiding activities that make it worse and making sure that she is in a good sleeping position/neck position while sleeping Discussed possible muscle relaxer-she deferred Can consider muscle relaxer or physical therapy if no improvement

## 2018-04-01 NOTE — Patient Instructions (Addendum)
Start zyrtec nightly and take for two weeks.  If the swelling occurs again you may need to see an allergist.  For an acute episode take benadryl.    For your neck it is likely muscular in nature. Take advil or aleve.  Apply heat, get a massage and stretch gently.     Call if no improvement

## 2018-04-15 ENCOUNTER — Ambulatory Visit: Payer: BLUE CROSS/BLUE SHIELD

## 2018-04-18 ENCOUNTER — Ambulatory Visit
Admission: RE | Admit: 2018-04-18 | Discharge: 2018-04-18 | Disposition: A | Discharge status: Home / Self Care | Payer: BLUE CROSS/BLUE SHIELD | Source: Ambulatory Visit | Attending: Internal Medicine | Admitting: Internal Medicine

## 2018-04-18 DIAGNOSIS — Z1231 Encounter for screening mammogram for malignant neoplasm of breast: Secondary | ICD-10-CM | POA: Diagnosis not present

## 2018-12-31 DIAGNOSIS — Z124 Encounter for screening for malignant neoplasm of cervix: Secondary | ICD-10-CM | POA: Diagnosis not present

## 2018-12-31 DIAGNOSIS — Z130 Encounter for screening for diseases of the blood and blood-forming organs and certain disorders involving the immune mechanism: Secondary | ICD-10-CM | POA: Diagnosis not present

## 2018-12-31 DIAGNOSIS — Z01419 Encounter for gynecological examination (general) (routine) without abnormal findings: Secondary | ICD-10-CM | POA: Diagnosis not present

## 2018-12-31 DIAGNOSIS — Z1151 Encounter for screening for human papillomavirus (HPV): Secondary | ICD-10-CM | POA: Diagnosis not present

## 2018-12-31 DIAGNOSIS — Z23 Encounter for immunization: Secondary | ICD-10-CM | POA: Diagnosis not present

## 2019-01-01 DIAGNOSIS — Z124 Encounter for screening for malignant neoplasm of cervix: Secondary | ICD-10-CM | POA: Diagnosis not present

## 2019-02-17 ENCOUNTER — Other Ambulatory Visit: Payer: Self-pay | Admitting: Internal Medicine

## 2019-02-17 DIAGNOSIS — Z1231 Encounter for screening mammogram for malignant neoplasm of breast: Secondary | ICD-10-CM

## 2019-03-17 ENCOUNTER — Encounter: Payer: BLUE CROSS/BLUE SHIELD | Admitting: Internal Medicine

## 2019-03-19 ENCOUNTER — Other Ambulatory Visit (INDEPENDENT_AMBULATORY_CARE_PROVIDER_SITE_OTHER): Payer: BC Managed Care – PPO

## 2019-03-19 ENCOUNTER — Encounter: Payer: Self-pay | Admitting: Internal Medicine

## 2019-03-19 ENCOUNTER — Ambulatory Visit (INDEPENDENT_AMBULATORY_CARE_PROVIDER_SITE_OTHER): Payer: BC Managed Care – PPO | Admitting: Internal Medicine

## 2019-03-19 ENCOUNTER — Other Ambulatory Visit: Payer: Self-pay

## 2019-03-19 VITALS — BP 110/70 | HR 71 | Temp 98.3°F | Ht 62.0 in | Wt 125.0 lb

## 2019-03-19 DIAGNOSIS — Z803 Family history of malignant neoplasm of breast: Secondary | ICD-10-CM

## 2019-03-19 DIAGNOSIS — E559 Vitamin D deficiency, unspecified: Secondary | ICD-10-CM | POA: Diagnosis not present

## 2019-03-19 DIAGNOSIS — Z0000 Encounter for general adult medical examination without abnormal findings: Secondary | ICD-10-CM

## 2019-03-19 LAB — COMPREHENSIVE METABOLIC PANEL
ALT: 10 U/L (ref 0–35)
AST: 15 U/L (ref 0–37)
Albumin: 4.1 g/dL (ref 3.5–5.2)
Alkaline Phosphatase: 69 U/L (ref 39–117)
BUN: 9 mg/dL (ref 6–23)
CO2: 27 mEq/L (ref 19–32)
Calcium: 9.2 mg/dL (ref 8.4–10.5)
Chloride: 105 mEq/L (ref 96–112)
Creatinine, Ser: 0.72 mg/dL (ref 0.40–1.20)
GFR: 88.57 mL/min (ref >60.00)
Glucose, Bld: 109 mg/dL — ABNORMAL HIGH (ref 70–99)
Potassium: 4.2 mEq/L (ref 3.5–5.1)
Sodium: 139 mEq/L (ref 135–145)
Total Bilirubin: 0.3 mg/dL (ref 0.2–1.2)
Total Protein: 7.3 g/dL (ref 6.0–8.3)

## 2019-03-19 LAB — CBC
HCT: 35.4 % — ABNORMAL LOW (ref 36.0–46.0)
Hemoglobin: 12.1 g/dL (ref 12.0–15.0)
MCHC: 34.2 g/dL (ref 30.0–36.0)
MCV: 84.2 fl (ref 78.0–100.0)
Platelets: 331 10*3/uL (ref 150.0–400.0)
RBC: 4.2 Mil/uL (ref 3.87–5.11)
RDW: 12.5 % (ref 11.5–15.5)
WBC: 4.9 10*3/uL (ref 4.0–10.5)

## 2019-03-19 LAB — LIPID PANEL
Cholesterol: 213 mg/dL — ABNORMAL HIGH (ref 0–200)
HDL: 64.6 mg/dL (ref >39.00)
LDL Cholesterol: 129 mg/dL — ABNORMAL HIGH (ref 0–99)
NonHDL: 148.55
Total CHOL/HDL Ratio: 3
Triglycerides: 96 mg/dL (ref 0.0–149.0)
VLDL: 19.2 mg/dL (ref 0.0–40.0)

## 2019-03-19 LAB — VITAMIN D 25 HYDROXY (VIT D DEFICIENCY, FRACTURES): VITD: 13.99 ng/mL — ABNORMAL LOW (ref 30.00–100.00)

## 2019-03-19 LAB — TSH: TSH: 1.37 u[IU]/mL (ref 0.35–4.50)

## 2019-03-19 LAB — VITAMIN B12: Vitamin B-12: 187 pg/mL — ABNORMAL LOW (ref 211–911)

## 2019-03-19 NOTE — Patient Instructions (Signed)
Health Maintenance, Female Adopting a healthy lifestyle and getting preventive care are important in promoting health and wellness. Ask your health care provider about:  The right schedule for you to have regular tests and exams.  Things you can do on your own to prevent diseases and keep yourself healthy. What should I know about diet, weight, and exercise? Eat a healthy diet   Eat a diet that includes plenty of vegetables, fruits, low-fat dairy products, and lean protein.  Do not eat a lot of foods that are high in solid fats, added sugars, or sodium. Maintain a healthy weight Body mass index (BMI) is used to identify weight problems. It estimates body fat based on height and weight. Your health care provider can help determine your BMI and help you achieve or maintain a healthy weight. Get regular exercise Get regular exercise. This is one of the most important things you can do for your health. Most adults should:  Exercise for at least 150 minutes each week. The exercise should increase your heart rate and make you sweat (moderate-intensity exercise).  Do strengthening exercises at least twice a week. This is in addition to the moderate-intensity exercise.  Spend less time sitting. Even light physical activity can be beneficial. Watch cholesterol and blood lipids Have your blood tested for lipids and cholesterol at 42 years of age, then have this test every 5 years. Have your cholesterol levels checked more often if:  Your lipid or cholesterol levels are high.  You are older than 42 years of age.  You are at high risk for heart disease. What should I know about cancer screening? Depending on your health history and family history, you may need to have cancer screening at various ages. This may include screening for:  Breast cancer.  Cervical cancer.  Colorectal cancer.  Skin cancer.  Lung cancer. What should I know about heart disease, diabetes, and high blood  pressure? Blood pressure and heart disease  High blood pressure causes heart disease and increases the risk of stroke. This is more likely to develop in people who have high blood pressure readings, are of African descent, or are overweight.  Have your blood pressure checked: ? Every 3-5 years if you are 18-39 years of age. ? Every year if you are 40 years old or older. Diabetes Have regular diabetes screenings. This checks your fasting blood sugar level. Have the screening done:  Once every three years after age 40 if you are at a normal weight and have a low risk for diabetes.  More often and at a younger age if you are overweight or have a high risk for diabetes. What should I know about preventing infection? Hepatitis B If you have a higher risk for hepatitis B, you should be screened for this virus. Talk with your health care provider to find out if you are at risk for hepatitis B infection. Hepatitis C Testing is recommended for:  Everyone born from 1945 through 1965.  Anyone with known risk factors for hepatitis C. Sexually transmitted infections (STIs)  Get screened for STIs, including gonorrhea and chlamydia, if: ? You are sexually active and are younger than 42 years of age. ? You are older than 42 years of age and your health care provider tells you that you are at risk for this type of infection. ? Your sexual activity has changed since you were last screened, and you are at increased risk for chlamydia or gonorrhea. Ask your health care provider if   you are at risk.  Ask your health care provider about whether you are at high risk for HIV. Your health care provider may recommend a prescription medicine to help prevent HIV infection. If you choose to take medicine to prevent HIV, you should first get tested for HIV. You should then be tested every 3 months for as long as you are taking the medicine. Pregnancy  If you are about to stop having your period (premenopausal) and  you may become pregnant, seek counseling before you get pregnant.  Take 400 to 800 micrograms (mcg) of folic acid every day if you become pregnant.  Ask for birth control (contraception) if you want to prevent pregnancy. Osteoporosis and menopause Osteoporosis is a disease in which the bones lose minerals and strength with aging. This can result in bone fractures. If you are 65 years old or older, or if you are at risk for osteoporosis and fractures, ask your health care provider if you should:  Be screened for bone loss.  Take a calcium or vitamin D supplement to lower your risk of fractures.  Be given hormone replacement therapy (HRT) to treat symptoms of menopause. Follow these instructions at home: Lifestyle  Do not use any products that contain nicotine or tobacco, such as cigarettes, e-cigarettes, and chewing tobacco. If you need help quitting, ask your health care provider.  Do not use street drugs.  Do not share needles.  Ask your health care provider for help if you need support or information about quitting drugs. Alcohol use  Do not drink alcohol if: ? Your health care provider tells you not to drink. ? You are pregnant, may be pregnant, or are planning to become pregnant.  If you drink alcohol: ? Limit how much you use to 0-1 drink a day. ? Limit intake if you are breastfeeding.  Be aware of how much alcohol is in your drink. In the [place] one drink equals one 12 oz bottle of beer (355 mL), one 5 oz glass of wine (148 mL), or one 1 oz glass of hard liquor (44 mL). General instructions  Schedule regular health, dental, and eye exams.  Stay current with your vaccines.  Tell your health care provider if: ? You often feel depressed. ? You have ever been abused or do not feel safe at home. Summary  Adopting a healthy lifestyle and getting preventive care are important in promoting health and wellness.  Follow your health care provider's instructions about healthy  diet, exercising, and getting tested or screened for diseases.  Follow your health care provider's instructions on monitoring your cholesterol and blood pressure. This information is not intended to replace advice given to you by your health care provider. Make sure you discuss any questions you have with your health care provider. Document Released: 10/30/2010 Document Revised: 04/09/2018 Document Reviewed: 04/09/2018 Elsevier Patient Education  2020 [employer]  

## 2019-03-19 NOTE — Progress Notes (Signed)
   Subjective:   Patient ID: Willette Brace, female    DOB: [birth year], 43 y.o.   MRN: [id number]  HPI The patient is a 43 YO female coming in for physical.   PMH, [facility], social history reviewed and updated.   Review of Systems  Constitutional: Negative.   HENT: Negative.   Eyes: Negative.   Respiratory: Negative for cough, chest tightness and shortness of breath.   Cardiovascular: Negative for chest pain, palpitations and leg swelling.  Gastrointestinal: Negative for abdominal distention, abdominal pain, constipation, diarrhea, nausea and vomiting.  Musculoskeletal: Negative.   Skin: Negative.   Neurological: Negative.   Psychiatric/Behavioral: Negative.     Objective:  Physical Exam Constitutional:      Appearance: She is well-developed.  HENT:     Head: Normocephalic and atraumatic.  Neck:     Musculoskeletal: Normal range of motion.  Cardiovascular:     Rate and Rhythm: Normal rate and regular rhythm.  Pulmonary:     Effort: Pulmonary effort is normal. No respiratory distress.     Breath sounds: Normal breath sounds. No wheezing or rales.  Abdominal:     General: Bowel sounds are normal. There is no distension.     Palpations: Abdomen is soft.     Tenderness: There is no abdominal tenderness. There is no rebound.  Skin:    General: Skin is warm and dry.  Neurological:     Mental Status: She is alert and oriented to person, place, and time.     Coordination: Coordination normal.     Vitals:   03/19/19 1414  BP: 110/70  Pulse: 71  Temp: 98.3 F (36.8 C)  TempSrc: Oral  SpO2: 99%  Weight: 125 lb (56.7 kg)  Height: 5\' 2"  (1.575 m)    Assessment & Plan:

## 2019-03-20 NOTE — Assessment & Plan Note (Signed)
Checking vitamin D and adjust as needed. Not currently on replacement.

## 2019-03-20 NOTE — Assessment & Plan Note (Signed)
Gets screening yearly.

## 2019-03-20 NOTE — Assessment & Plan Note (Signed)
Flu shot up to date. Tetanus up to date. Mammogram yearly up to date, pap smear up to date. Counseled about sun safety and mole surveillance. Counseled about the dangers of distracted driving. Given 10 year screening recommendations.

## 2019-04-20 ENCOUNTER — Ambulatory Visit
Admission: RE | Admit: 2019-04-20 | Discharge: 2019-04-20 | Disposition: A | Discharge status: Home / Self Care | Payer: BLUE CROSS/BLUE SHIELD | Source: Ambulatory Visit | Attending: Internal Medicine | Admitting: Internal Medicine

## 2019-04-20 ENCOUNTER — Other Ambulatory Visit: Payer: Self-pay

## 2019-04-20 DIAGNOSIS — Z1231 Encounter for screening mammogram for malignant neoplasm of breast: Secondary | ICD-10-CM

## 2019-08-21 DIAGNOSIS — Z20828 Contact with and (suspected) exposure to other viral communicable diseases: Secondary | ICD-10-CM | POA: Diagnosis not present

## 2020-04-04 ENCOUNTER — Other Ambulatory Visit: Payer: Self-pay | Admitting: Obstetrics and Gynecology

## 2020-04-04 DIAGNOSIS — Z1231 Encounter for screening mammogram for malignant neoplasm of breast: Secondary | ICD-10-CM

## 2020-05-27 ENCOUNTER — Ambulatory Visit: Payer: BC Managed Care – PPO

## 2020-06-13 ENCOUNTER — Encounter: Payer: Self-pay | Admitting: Internal Medicine

## 2020-06-13 ENCOUNTER — Other Ambulatory Visit: Payer: Self-pay

## 2020-06-13 ENCOUNTER — Ambulatory Visit (INDEPENDENT_AMBULATORY_CARE_PROVIDER_SITE_OTHER): Payer: Managed Care, Other (non HMO) | Admitting: Internal Medicine

## 2020-06-13 VITALS — BP 102/80 | HR 79 | Temp 98.3°F | Resp 18 | Ht 62.0 in | Wt 128.8 lb

## 2020-06-13 DIAGNOSIS — E559 Vitamin D deficiency, unspecified: Secondary | ICD-10-CM | POA: Diagnosis not present

## 2020-06-13 DIAGNOSIS — Z803 Family history of malignant neoplasm of breast: Secondary | ICD-10-CM

## 2020-06-13 DIAGNOSIS — Z0000 Encounter for general adult medical examination without abnormal findings: Secondary | ICD-10-CM | POA: Diagnosis not present

## 2020-06-13 DIAGNOSIS — E538 Deficiency of other specified B group vitamins: Secondary | ICD-10-CM | POA: Diagnosis not present

## 2020-06-13 LAB — COMPREHENSIVE METABOLIC PANEL
ALT: 13 U/L (ref 0–35)
AST: 17 U/L (ref 0–37)
Albumin: 4.1 g/dL (ref 3.5–5.2)
Alkaline Phosphatase: 65 U/L (ref 39–117)
BUN: 8 mg/dL (ref 6–23)
CO2: 27 mEq/L (ref 19–32)
Calcium: 9.1 mg/dL (ref 8.4–10.5)
Chloride: 103 mEq/L (ref 96–112)
Creatinine, Ser: 0.73 mg/dL (ref 0.40–1.20)
GFR: 100.43 mL/min (ref >60.00)
Glucose, Bld: 99 mg/dL (ref 70–99)
Potassium: 4 mEq/L (ref 3.5–5.1)
Sodium: 137 mEq/L (ref 135–145)
Total Bilirubin: 0.3 mg/dL (ref 0.2–1.2)
Total Protein: 7.5 g/dL (ref 6.0–8.3)

## 2020-06-13 LAB — LIPID PANEL
Cholesterol: 205 mg/dL — ABNORMAL HIGH (ref 0–200)
HDL: 64.4 mg/dL (ref >39.00)
LDL Cholesterol: 123 mg/dL — ABNORMAL HIGH (ref 0–99)
NonHDL: 140.42
Total CHOL/HDL Ratio: 3
Triglycerides: 88 mg/dL (ref 0.0–149.0)
VLDL: 17.6 mg/dL (ref 0.0–40.0)

## 2020-06-13 LAB — CBC
HCT: 34.8 % — ABNORMAL LOW (ref 36.0–46.0)
Hemoglobin: 11.9 g/dL — ABNORMAL LOW (ref 12.0–15.0)
MCHC: 34.3 g/dL (ref 30.0–36.0)
MCV: 82.1 fl (ref 78.0–100.0)
Platelets: 344 10*3/uL (ref 150.0–400.0)
RBC: 4.24 Mil/uL (ref 3.87–5.11)
RDW: 12.5 % (ref 11.5–15.5)
WBC: 6.4 10*3/uL (ref 4.0–10.5)

## 2020-06-13 LAB — TSH: TSH: 1.35 u[IU]/mL (ref 0.35–4.50)

## 2020-06-13 LAB — VITAMIN D 25 HYDROXY (VIT D DEFICIENCY, FRACTURES): VITD: 10.84 ng/mL — ABNORMAL LOW (ref 30.00–100.00)

## 2020-06-13 LAB — VITAMIN B12: Vitamin B-12: 162 pg/mL — ABNORMAL LOW (ref 211–911)

## 2020-06-13 NOTE — Assessment & Plan Note (Addendum)
Talked to her about different vegan friendly supplements. Checking vitamin B12 level.

## 2020-06-13 NOTE — Assessment & Plan Note (Signed)
Checking vitamin D level. Adjust as needed.  

## 2020-06-13 NOTE — Assessment & Plan Note (Signed)
Flu shot declines. Covid-19 up to date including booster. Tetanus up to date. Mammogram due getting next month, pap smear up to date. Counseled about sun safety and mole surveillance. Counseled about the dangers of distracted driving. Given 10 year screening recommendations.

## 2020-06-13 NOTE — Assessment & Plan Note (Signed)
Due for mammogram and scheduled for next month. Gyn does breast exam currently.

## 2020-06-13 NOTE — Patient Instructions (Addendum)
Look at nutritional yeast to supplement the B12 without having to take pills.   Health Maintenance, Female Adopting a healthy lifestyle and getting preventive care are important in promoting health and wellness. Ask your health care provider about:  The right schedule for you to have regular tests and exams.  Things you can do on your own to prevent diseases and keep yourself healthy. What should I know about diet, weight, and exercise? Eat a healthy diet  Eat a diet that includes plenty of vegetables, fruits, low-fat dairy products, and lean protein.  Do not eat a lot of foods that are high in solid fats, added sugars, or sodium.   Maintain a healthy weight Body mass index (BMI) is used to identify weight problems. It estimates body fat based on height and weight. Your health care provider can help determine your BMI and help you achieve or maintain a healthy weight. Get regular exercise Get regular exercise. This is one of the most important things you can do for your health. Most adults should:  Exercise for at least 150 minutes each week. The exercise should increase your heart rate and make you sweat (moderate-intensity exercise).  Do strengthening exercises at least twice a week. This is in addition to the moderate-intensity exercise.  Spend less time sitting. Even light physical activity can be beneficial. Watch cholesterol and blood lipids Have your blood tested for lipids and cholesterol at 44 years of age, then have this test every 5 years. Have your cholesterol levels checked more often if:  Your lipid or cholesterol levels are high.  You are older than 44 years of age.  You are at high risk for heart disease. What should I know about cancer screening? Depending on your health history and family history, you may need to have cancer screening at various ages. This may include screening for:  Breast cancer.  Cervical cancer.  Colorectal cancer.  Skin cancer.  Lung  cancer. What should I know about heart disease, diabetes, and high blood pressure? Blood pressure and heart disease  High blood pressure causes heart disease and increases the risk of stroke. This is more likely to develop in people who have high blood pressure readings, are of African descent, or are overweight.  Have your blood pressure checked: ? Every 3-5 years if you are 27-83 years of age. ? Every year if you are 25 years old or older. Diabetes Have regular diabetes screenings. This checks your fasting blood sugar level. Have the screening done:  Once every three years after age 70 if you are at a normal weight and have a low risk for diabetes.  More often and at a younger age if you are overweight or have a high risk for diabetes. What should I know about preventing infection? Hepatitis B If you have a higher risk for hepatitis B, you should be screened for this virus. Talk with your health care provider to find out if you are at risk for hepatitis B infection. Hepatitis C Testing is recommended for:  Everyone born from [birth year] through 1965.  Anyone with known risk factors for hepatitis C. Sexually transmitted infections (STIs)  Get screened for STIs, including gonorrhea and chlamydia, if: ? You are sexually active and are younger than 44 years of age. ? You are older than 44 years of age and your health care provider tells you that you are at risk for this type of infection. ? Your sexual activity has changed since you were last screened,  and you are at increased risk for chlamydia or gonorrhea. Ask your health care provider if you are at risk.  Ask your health care provider about whether you are at high risk for HIV. Your health care provider may recommend a prescription medicine to help prevent HIV infection. If you choose to take medicine to prevent HIV, you should first get tested for HIV. You should then be tested every 3 months for as long as you are taking the  medicine. Pregnancy  If you are about to stop having your period (premenopausal) and you may become pregnant, seek counseling before you get pregnant.  Take 400 to 800 micrograms (mcg) of folic acid every day if you become pregnant.  Ask for birth control (contraception) if you want to prevent pregnancy. Osteoporosis and menopause Osteoporosis is a disease in which the bones lose minerals and strength with aging. This can result in bone fractures. If you are 62 years old or older, or if you are at risk for osteoporosis and fractures, ask your health care provider if you should:  Be screened for bone loss.  Take a calcium or vitamin D supplement to lower your risk of fractures.  Be given hormone replacement therapy (HRT) to treat symptoms of menopause. Follow these instructions at home: Lifestyle  Do not use any products that contain nicotine or tobacco, such as cigarettes, e-cigarettes, and chewing tobacco. If you need help quitting, ask your health care provider.  Do not use street drugs.  Do not share needles.  Ask your health care provider for help if you need support or information about quitting drugs. Alcohol use  Do not drink alcohol if: ? Your health care provider tells you not to drink. ? You are pregnant, may be pregnant, or are planning to become pregnant.  If you drink alcohol: ? Limit how much you use to 0-1 drink a day. ? Limit intake if you are breastfeeding.  Be aware of how much alcohol is in your drink. In the [place] one drink equals one 12 oz bottle of beer (355 mL), one 5 oz glass of wine (148 mL), or one 1 oz glass of hard liquor (44 mL). General instructions  Schedule regular health, dental, and eye exams.  Stay current with your vaccines.  Tell your health care provider if: ? You often feel depressed. ? You have ever been abused or do not feel safe at home. Summary  Adopting a healthy lifestyle and getting preventive care are important in  promoting health and wellness.  Follow your health care provider's instructions about healthy diet, exercising, and getting tested or screened for diseases.  Follow your health care provider's instructions on monitoring your cholesterol and blood pressure. This information is not intended to replace advice given to you by your health care provider. Make sure you discuss any questions you have with your health care provider. Document Revised: 04/09/2018 Document Reviewed: 04/09/2018 Elsevier Patient Education  2021 [employer].

## 2020-06-13 NOTE — Progress Notes (Unsigned)
   Subjective:   Patient ID: Shelley Long, female    DOB: Jul 01, 1975, 45 y.o.   MRN: [id number]  HPI The patient is a 45 YO female coming in for physical.  PMH, Uncertain, social history reviewed and updated  Review of Systems  Constitutional: Negative.   HENT: Negative.   Eyes: Negative.   Respiratory: Negative for cough, chest tightness and shortness of breath.   Cardiovascular: Negative for chest pain, palpitations and leg swelling.  Gastrointestinal: Negative for abdominal distention, abdominal pain, constipation, diarrhea, nausea and vomiting.  Musculoskeletal: Negative.   Skin: Negative.   Neurological: Negative.   Psychiatric/Behavioral: Negative.     Objective:  Physical Exam Constitutional:      Appearance: She is well-developed and well-nourished.  HENT:     Head: Normocephalic and atraumatic.  Eyes:     Extraocular Movements: EOM normal.  Cardiovascular:     Rate and Rhythm: Normal rate and regular rhythm.  Pulmonary:     Effort: Pulmonary effort is normal. No respiratory distress.     Breath sounds: Normal breath sounds. No wheezing or rales.  Abdominal:     General: Bowel sounds are normal. There is no distension.     Palpations: Abdomen is soft.     Tenderness: There is no abdominal tenderness. There is no rebound.  Musculoskeletal:        General: No edema.     Cervical back: Normal range of motion.  Skin:    General: Skin is warm and dry.  Neurological:     Mental Status: She is alert and oriented to person, place, and time.     Coordination: Coordination normal.  Psychiatric:        Mood and Affect: Mood and affect normal.     Vitals:   06/13/20 1355  BP: 102/80  Pulse: 79  Resp: 18  Temp: 98.3 F (36.8 C)  TempSrc: Oral  SpO2: 100%  Weight: 128 lb 12.8 oz (58.4 kg)  Height: 5\' 2"  (1.575 m)   This visit occurred during the SARS-CoV-2 public health emergency.  Safety protocols were in place, including screening questions prior to the visit,  additional usage of staff PPE, and extensive cleaning of exam room while observing appropriate contact time as indicated for disinfecting solutions.   Assessment & Plan:

## 2020-06-14 ENCOUNTER — Other Ambulatory Visit: Payer: Self-pay | Admitting: Internal Medicine

## 2020-06-14 MED ORDER — VITAMIN D (ERGOCALCIFEROL) 1.25 MG (50000 UNIT) PO CAPS: 50000.0000 [IU] | ORAL_CAPSULE | ORAL | 0 refills | Status: DC

## 2020-06-18 ENCOUNTER — Encounter: Payer: Self-pay | Admitting: Internal Medicine

## 2020-07-05 ENCOUNTER — Ambulatory Visit: Payer: Self-pay

## 2020-07-11 ENCOUNTER — Inpatient Hospital Stay: Admission: RE | Admit: 2020-07-11 | Payer: Self-pay | Source: Ambulatory Visit

## 2020-07-11 ENCOUNTER — Ambulatory Visit
Admission: RE | Admit: 2020-07-11 | Discharge: 2020-07-11 | Disposition: A | Discharge status: Home / Self Care | Payer: Managed Care, Other (non HMO) | Source: Ambulatory Visit | Attending: Obstetrics and Gynecology | Admitting: Obstetrics and Gynecology

## 2020-07-11 ENCOUNTER — Other Ambulatory Visit: Payer: Self-pay

## 2020-07-11 DIAGNOSIS — Z1231 Encounter for screening mammogram for malignant neoplasm of breast: Secondary | ICD-10-CM

## 2020-09-02 IMAGING — MG DIGITAL SCREENING BILAT W/ TOMO W/ CAD
8 series · 9 of 24 positions shown · non-contrast
Comparison: Previous exam(s).

CLINICAL DATA: Screening.

EXAM:
DIGITAL SCREENING BILATERAL MAMMOGRAM WITH TOMO AND CAD

[R CC synth-2D]
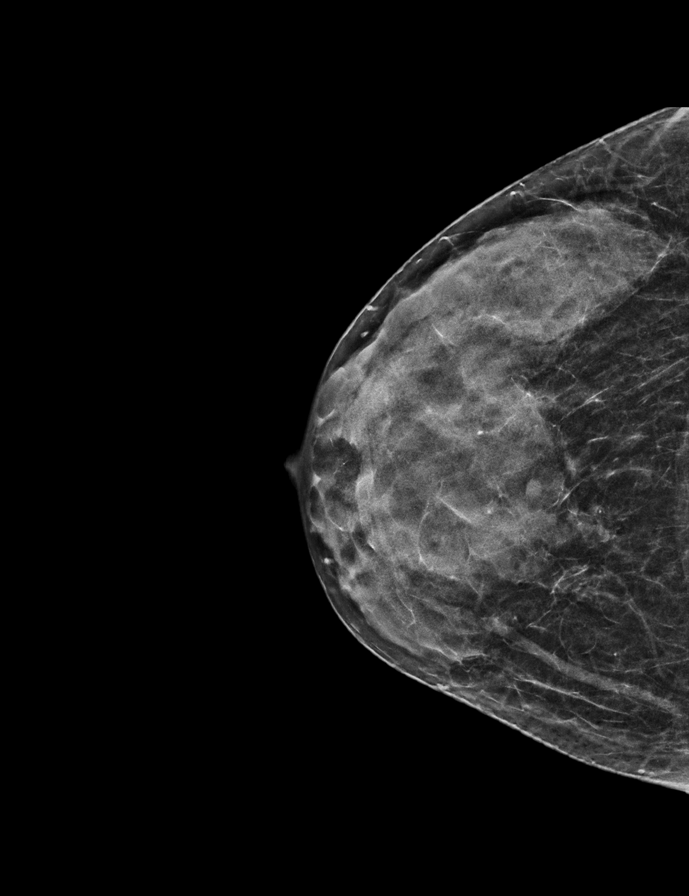

[L MLO synth-2D]
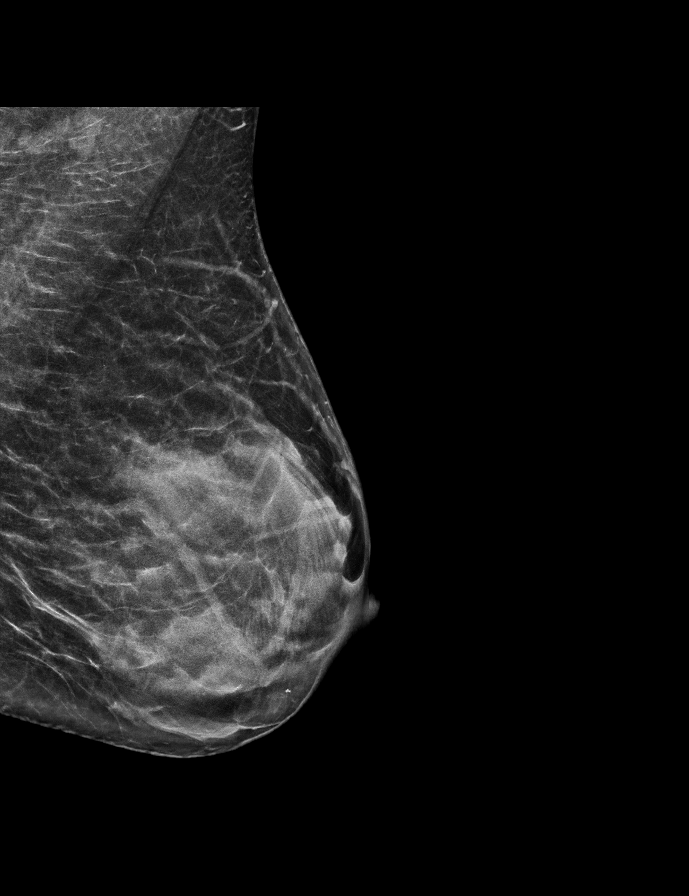

[R MLO synth-2D]
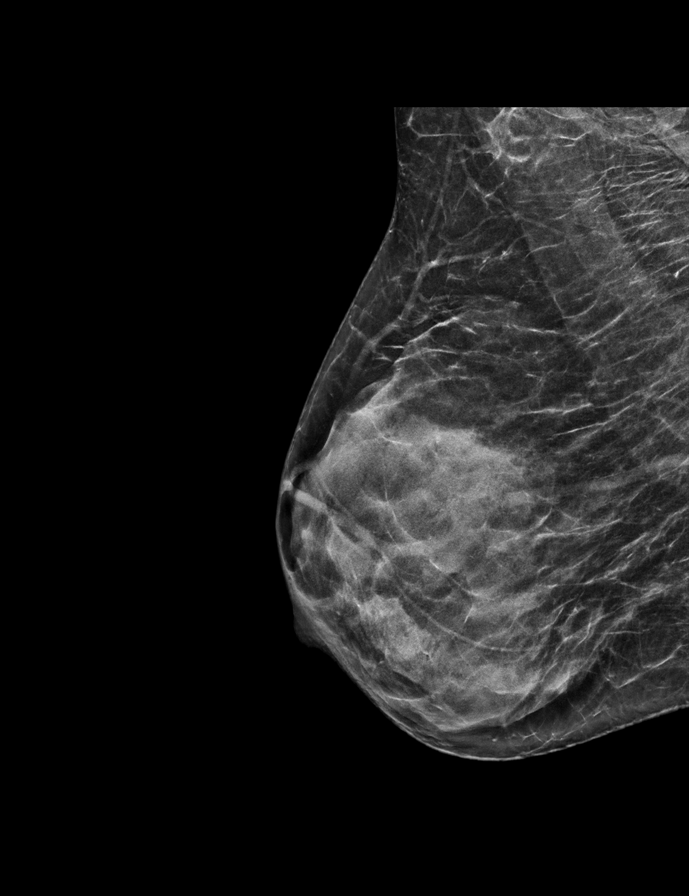

[L CC synth-2D]
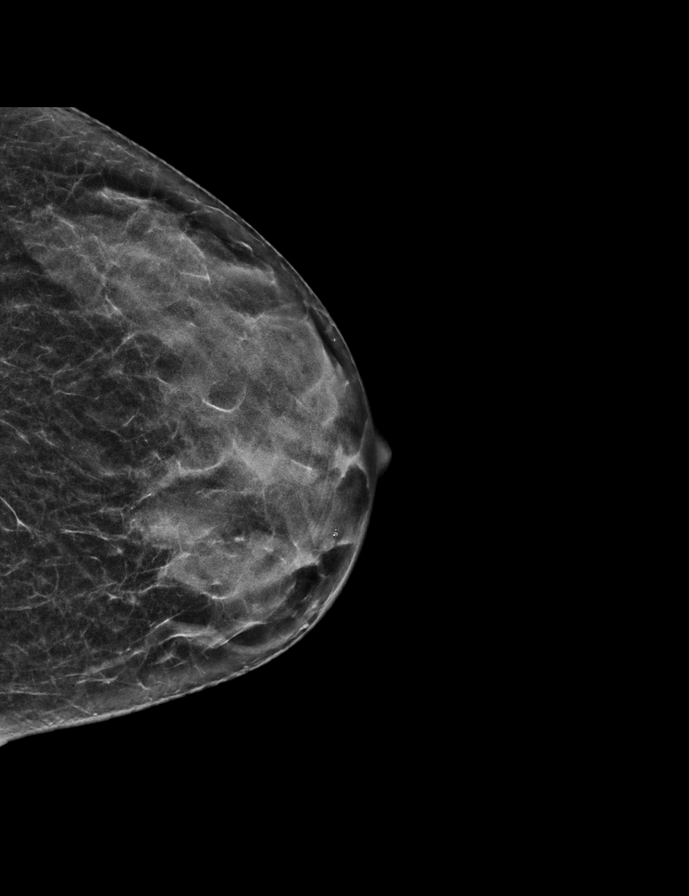

[R MLO tomo · 2 of 52 frames shown]
[frame 17/52]
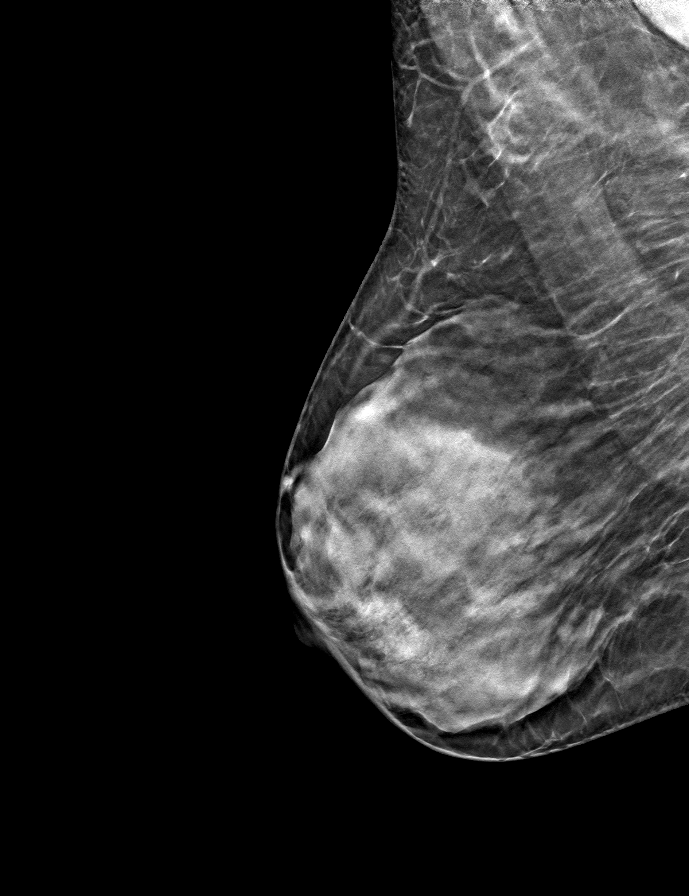
[frame 27/52]
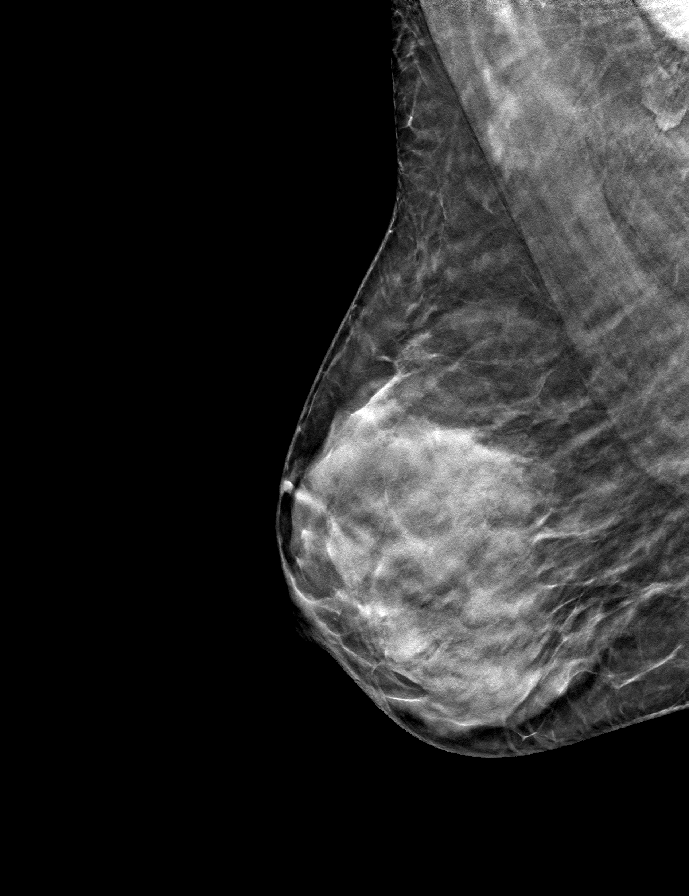

[L CC tomo · tomo slice 23/46.0]
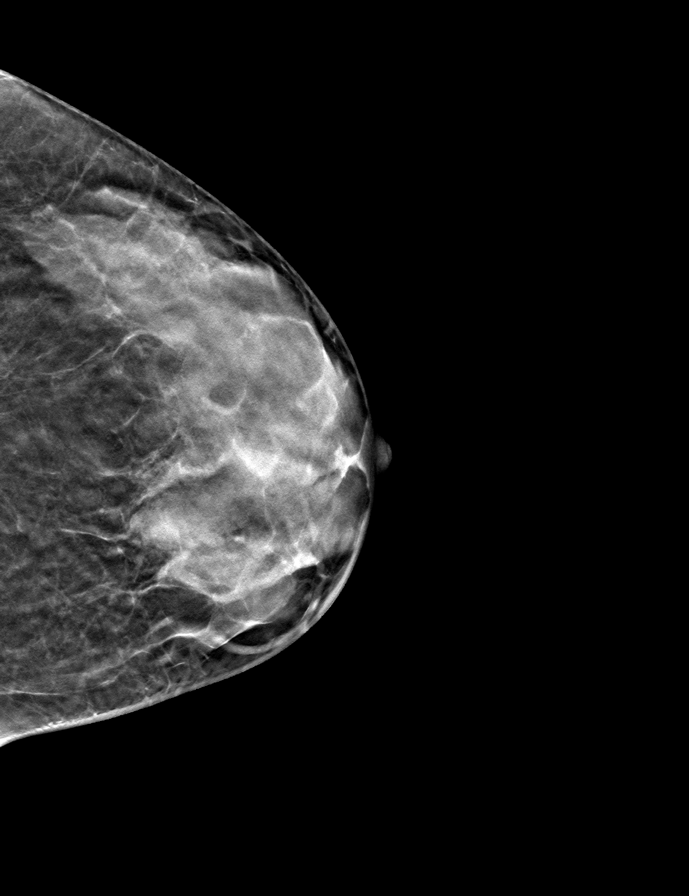

[R CC tomo · tomo slice 23/45.0]
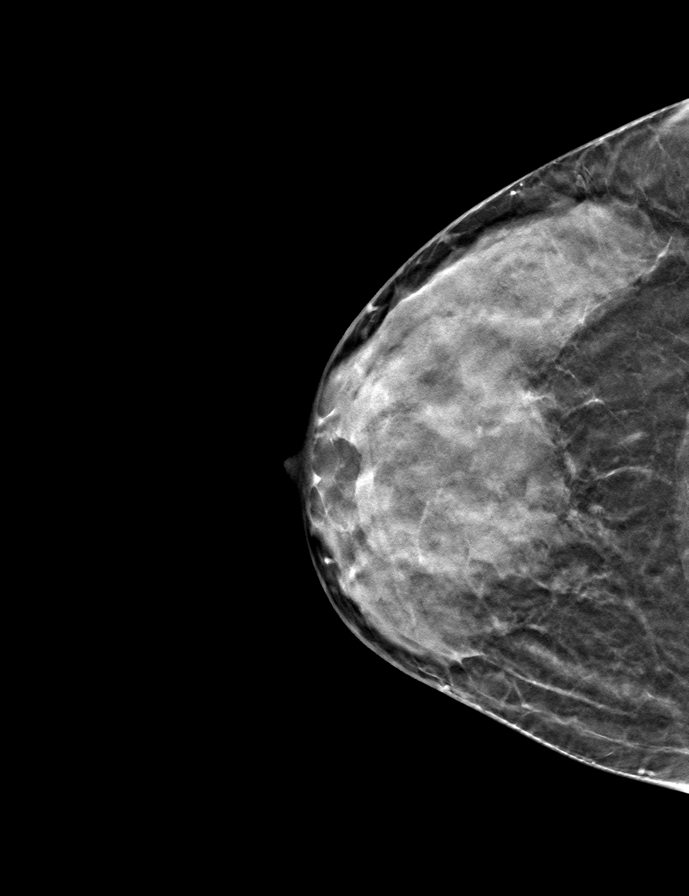

[L MLO tomo · tomo slice 27/53.0]
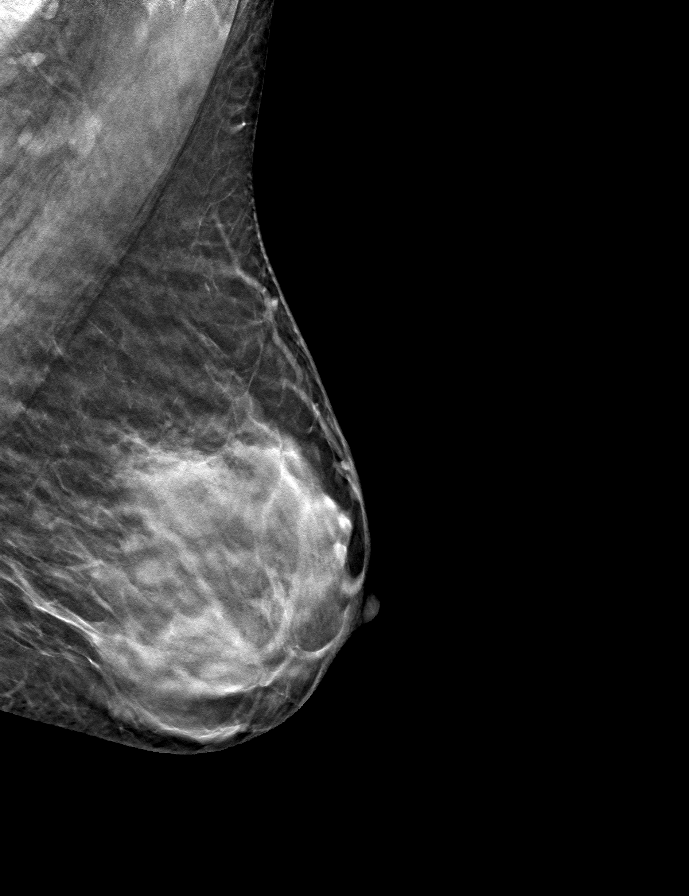

[9 of 24 positions shown; findings below may reference images not displayed]

ACR Breast Density Category c: The breast tissue is heterogeneously
dense, which may obscure small masses.
FINDINGS: There are no findings suspicious for malignancy. Images were
processed with CAD.
IMPRESSION: No mammographic evidence of malignancy. A result letter of this
screening mammogram will be mailed directly to the patient.

RECOMMENDATION:
Screening mammogram in one year. (Code:[id number]-U-LHB)

BI-RADS CATEGORY  1: Negative.

## 2020-10-03 ENCOUNTER — Encounter: Payer: Self-pay | Admitting: Internal Medicine

## 2020-10-03 DIAGNOSIS — R197 Diarrhea, unspecified: Secondary | ICD-10-CM

## 2020-10-05 ENCOUNTER — Ambulatory Visit: Payer: Managed Care, Other (non HMO) | Admitting: Internal Medicine

## 2020-10-05 ENCOUNTER — Encounter: Payer: Self-pay | Admitting: Internal Medicine

## 2020-10-05 ENCOUNTER — Other Ambulatory Visit: Payer: Self-pay

## 2020-10-05 VITALS — BP 112/80 | HR 84 | Temp 98.3°F | Resp 18 | Ht 62.0 in | Wt 123.2 lb

## 2020-10-05 DIAGNOSIS — R197 Diarrhea, unspecified: Secondary | ICD-10-CM | POA: Diagnosis not present

## 2020-10-05 DIAGNOSIS — E559 Vitamin D deficiency, unspecified: Secondary | ICD-10-CM | POA: Diagnosis not present

## 2020-10-05 DIAGNOSIS — E538 Deficiency of other specified B group vitamins: Secondary | ICD-10-CM | POA: Diagnosis not present

## 2020-10-05 LAB — CBC
HCT: 36.1 % (ref 36.0–46.0)
Hemoglobin: 12.4 g/dL (ref 12.0–15.0)
MCHC: 34.4 g/dL (ref 30.0–36.0)
MCV: 82.1 fl (ref 78.0–100.0)
Platelets: 344 10*3/uL (ref 150.0–400.0)
RBC: 4.4 Mil/uL (ref 3.87–5.11)
RDW: 12.4 % (ref 11.5–15.5)
WBC: 6.2 10*3/uL (ref 4.0–10.5)

## 2020-10-05 LAB — COMPREHENSIVE METABOLIC PANEL
ALT: 10 U/L (ref 0–35)
AST: 15 U/L (ref 0–37)
Albumin: 4.1 g/dL (ref 3.5–5.2)
Alkaline Phosphatase: 65 U/L (ref 39–117)
BUN: 6 mg/dL (ref 6–23)
CO2: 26 mEq/L (ref 19–32)
Calcium: 9.1 mg/dL (ref 8.4–10.5)
Chloride: 103 mEq/L (ref 96–112)
Creatinine, Ser: 0.78 mg/dL (ref 0.40–1.20)
GFR: 92.55 mL/min (ref >60.00)
Glucose, Bld: 87 mg/dL (ref 70–99)
Potassium: 3.9 mEq/L (ref 3.5–5.1)
Sodium: 136 mEq/L (ref 135–145)
Total Bilirubin: 0.5 mg/dL (ref 0.2–1.2)
Total Protein: 7.5 g/dL (ref 6.0–8.3)

## 2020-10-05 LAB — VITAMIN B12: Vitamin B-12: 223 pg/mL (ref 211–911)

## 2020-10-05 LAB — VITAMIN D 25 HYDROXY (VIT D DEFICIENCY, FRACTURES): VITD: 25.05 ng/mL — ABNORMAL LOW (ref 30.00–100.00)

## 2020-10-05 NOTE — Progress Notes (Signed)
   Subjective:   Patient ID: Shelley Long, female    DOB: Dec 11, 1975, 45 y.o.   MRN: [id number]  HPI The patient is a 45 YO female coming in for concerns about GI illness (diarrhea and feeling feverish, started 2 weeks ago, has tried herbal otc and this is helping, denies blood in diarrhea), and B12 deficiency (decided to take otc vitamin B12 100 mg (1000 mcg) daily, not sure if tiredness improved) and vitamin D deficiency (taken high dose replacement, denies change in symptoms). Went to [place] end of April.   Review of Systems  Constitutional:  Positive for appetite change and fatigue.  HENT: Negative.    Eyes: Negative.   Respiratory:  Negative for cough, chest tightness and shortness of breath.   Cardiovascular:  Negative for chest pain, palpitations and leg swelling.  Gastrointestinal:  Positive for diarrhea. Negative for abdominal distention, abdominal pain, constipation, nausea and vomiting.  Musculoskeletal: Negative.   Skin: Negative.   Neurological: Negative.   Psychiatric/Behavioral: Negative.     Objective:  Physical Exam Constitutional:      Appearance: She is well-developed.  HENT:     Head: Normocephalic and atraumatic.  Cardiovascular:     Rate and Rhythm: Normal rate and regular rhythm.  Pulmonary:     Effort: Pulmonary effort is normal. No respiratory distress.     Breath sounds: Normal breath sounds. No wheezing or rales.  Abdominal:     General: Bowel sounds are normal. There is no distension.     Palpations: Abdomen is soft.     Tenderness: There is no abdominal tenderness. There is no rebound.  Musculoskeletal:     Cervical back: Normal range of motion.  Skin:    General: Skin is warm and dry.  Neurological:     Mental Status: She is alert and oriented to person, place, and time.     Coordination: Coordination normal.    Vitals:   10/05/20 0850  BP: 112/80  Pulse: 84  Resp: 18  Temp: 98.3 F (36.8 C)  TempSrc: Oral  SpO2: 99%  Weight: 123 lb  3.2 oz (55.9 kg)  Height: 5\' 2"  (1.575 m)    This visit occurred during the SARS-CoV-2 public health emergency.  Safety protocols were in place, including screening questions prior to the visit, additional usage of staff PPE, and extensive cleaning of exam room while observing appropriate contact time as indicated for disinfecting solutions.   Assessment & Plan:

## 2020-10-05 NOTE — Patient Instructions (Signed)
We will check the labs and the diarrhea to find out if there is a cause for the symptoms you are having.

## 2020-10-06 ENCOUNTER — Ambulatory Visit: Payer: Managed Care, Other (non HMO) | Admitting: Internal Medicine

## 2020-10-06 DIAGNOSIS — R197 Diarrhea, unspecified: Secondary | ICD-10-CM | POA: Insufficient documentation

## 2020-10-06 NOTE — Assessment & Plan Note (Signed)
Checking B12 and is taking oral replacement. If not improving needs B12 shots to replace then can switch back to oral.

## 2020-10-06 NOTE — Assessment & Plan Note (Signed)
Could be post-viral with poor appetite and energy. The diarrhea is finally subsiding. We will check GI profile PCR testing due to recent international travel.

## 2020-10-06 NOTE — Assessment & Plan Note (Addendum)
Has taken high dose replacement so checking vitamin D levels. Will remove from list as she is done taking.

## 2020-10-07 ENCOUNTER — Ambulatory Visit (INDEPENDENT_AMBULATORY_CARE_PROVIDER_SITE_OTHER): Payer: Managed Care, Other (non HMO)

## 2020-10-07 ENCOUNTER — Other Ambulatory Visit: Payer: Self-pay

## 2020-10-07 DIAGNOSIS — E538 Deficiency of other specified B group vitamins: Secondary | ICD-10-CM

## 2020-10-07 MED ORDER — CYANOCOBALAMIN 1000 MCG/ML IJ SOLN
1000.0000 ug | Freq: Once | INTRAMUSCULAR | Status: AC
  Administered 2020-10-07: 1000 ug via INTRAMUSCULAR

## 2020-10-07 NOTE — Progress Notes (Signed)
Pt here for biweekly B12 injection per Dr. [name]  B12 1035mcg given IM, and pt tolerated injection well.  Next B12 injection scheduled for 10/26/20

## 2020-10-08 LAB — GI PROFILE, STOOL, PCR

## 2020-10-17 ENCOUNTER — Encounter: Payer: Self-pay | Admitting: Internal Medicine

## 2020-10-19 ENCOUNTER — Ambulatory Visit (INDEPENDENT_AMBULATORY_CARE_PROVIDER_SITE_OTHER): Payer: Managed Care, Other (non HMO)

## 2020-10-19 ENCOUNTER — Other Ambulatory Visit: Payer: Self-pay

## 2020-10-19 DIAGNOSIS — E538 Deficiency of other specified B group vitamins: Secondary | ICD-10-CM

## 2020-10-19 MED ORDER — CYANOCOBALAMIN 1000 MCG/ML IJ SOLN
1000.0000 ug | Freq: Once | INTRAMUSCULAR | Status: AC
  Administered 2020-10-19: 1000 ug via INTRAMUSCULAR

## 2020-10-19 NOTE — Progress Notes (Signed)
Patient came into the office today receive her vitamin b12 injection. She tolerated the injection well in her left arm. No questions or concerns at this time.

## 2020-10-20 ENCOUNTER — Ambulatory Visit: Payer: Managed Care, Other (non HMO)

## 2020-10-20 NOTE — Progress Notes (Signed)
b12 Injection given.   Shelley Leisey J Bode Pieper, MD  

## 2020-10-23 ENCOUNTER — Encounter: Payer: Self-pay | Admitting: Internal Medicine

## 2020-10-26 ENCOUNTER — Ambulatory Visit: Payer: Managed Care, Other (non HMO)

## 2020-11-02 ENCOUNTER — Other Ambulatory Visit: Payer: Self-pay

## 2020-11-02 ENCOUNTER — Ambulatory Visit: Payer: Managed Care, Other (non HMO)

## 2020-11-02 ENCOUNTER — Ambulatory Visit (INDEPENDENT_AMBULATORY_CARE_PROVIDER_SITE_OTHER): Payer: Managed Care, Other (non HMO)

## 2020-11-02 ENCOUNTER — Telehealth: Payer: Self-pay | Admitting: Internal Medicine

## 2020-11-02 DIAGNOSIS — E538 Deficiency of other specified B group vitamins: Secondary | ICD-10-CM | POA: Diagnosis not present

## 2020-11-02 MED ORDER — CYANOCOBALAMIN 1000 MCG/ML IJ SOLN
1000.0000 ug | INTRAMUSCULAR | Status: AC
  Administered 2020-11-02: 1000 ug via INTRAMUSCULAR

## 2020-11-02 NOTE — Telephone Encounter (Signed)
Hoyt Koch, MD  10/06/2020  3:47 PM EDT      Your vitamin B12 is still very low so I would recommend to start B12 shotsevery 2 weeks for 2 months then switch back to oral B12. Other labs normal.

## 2020-11-02 NOTE — Telephone Encounter (Signed)
   Patient called and is wondering how many b12 shots she is supposed to get.

## 2020-11-02 NOTE — Telephone Encounter (Signed)
See below

## 2020-11-02 NOTE — Telephone Encounter (Signed)
See my chart message

## 2020-11-02 NOTE — Progress Notes (Addendum)
Pt here for biweekly B12 injection per Dr [name]  B12 1079mcg given IM in left deltoid and pt tolerated injection well.  Next B12 injection scheduled for 11/21/20 (pt will be out of town the previous week).  Dr Oletha Blend: Please cosign since PCP is out of office.  Medical screening examination/treatment/procedure(s) were performed by non-physician practitioner and as supervising physician I was immediately available for consultation/collaboration.  I agree with above. Lew Dawes, MD

## 2020-11-04 LAB — HM COLONOSCOPY

## 2020-11-10 NOTE — Telephone Encounter (Signed)
Requested EOB to investigate what pt was being billed for.  Pt states she called [employer] & was told "I don't need to worry about it" Closing this encounter based on patient information.

## 2020-11-15 ENCOUNTER — Encounter: Payer: Self-pay | Admitting: Internal Medicine

## 2020-11-21 ENCOUNTER — Other Ambulatory Visit: Payer: Self-pay

## 2020-11-21 ENCOUNTER — Telehealth (INDEPENDENT_AMBULATORY_CARE_PROVIDER_SITE_OTHER): Payer: Managed Care, Other (non HMO) | Admitting: Family Medicine

## 2020-11-21 ENCOUNTER — Encounter: Payer: Self-pay | Admitting: Family Medicine

## 2020-11-21 ENCOUNTER — Ambulatory Visit: Payer: Managed Care, Other (non HMO)

## 2020-11-21 DIAGNOSIS — B370 Candidal stomatitis: Secondary | ICD-10-CM

## 2020-11-21 DIAGNOSIS — R195 Other fecal abnormalities: Secondary | ICD-10-CM | POA: Diagnosis not present

## 2020-11-21 DIAGNOSIS — K22719 Barrett's esophagus with dysplasia, unspecified: Secondary | ICD-10-CM

## 2020-11-21 MED ORDER — FLUCONAZOLE 200 MG PO TABS: ORAL_TABLET | ORAL | 0 refills | Status: DC

## 2020-11-21 NOTE — Progress Notes (Signed)
Chief Complaint  Patient presents with   Thrush    Subjective: Patient is a 45 y.o. female here for thrush in mouth. Due to COVID-19 pandemic, we are interacting via web portal for an electronic face-to-face visit. I verified patient's ID using 2 identifiers. Patient agreed to proceed with visit via this method. Patient is at home, I am at office. Patient and I are present for visit.   Over the past couple mo, the pt has been dealing with gastrointestinal issues.  She had a work-up with her PCP done that revealed low B12, vitamin D.  She feels better overall since that was resolved.  Over the past 2 days she has been having looser stools.  No recent change in eating or antibiotic use.  She had a normal colonoscopy on 7/8.  Denies any bleeding or unintentional weight loss.  She has been having pain and white spots in her mouth over the past several weeks.  She is unsure when it started because she thought it was normal.  One of her cousins is a physician and told her she has thrush.  She has slight discomfort when she swallows and is concerned it is in her esophagus.  Past Medical History:  Diagnosis Date   Abdominal pain    Delivery by vacuum extractor affecting fetus or newborn 11/30/2011   Diastasis recti s/p primary closure/repair 11/13/2010   Hx of ectopic pregnancy    Normal pregnancy 11/30/2011   Skin moles    abnormal per medical history form dated 11/13/10.    Objective: No conversational dyspnea Age appropriate judgment and insight Nml affect and mood  Assessment and Plan: Thrush - Plan: fluconazole (DIFLUCAN) 200 MG tablet  Unlikely this is esophageal Candida, but will do the 14 days of fluconazole.  400 mg for the first day and 200 mg for the remaining duration. Wait a long conversation about potential etiologies for loose stools as well as her reflux and newfound Barrett's esophagus. She will keep a symptom journal and trial Metamucil for the stools.  If no improvement, she  will follow-up with her gastroenterologist. She will follow-up as originally scheduled with her regular PCP. The patient voiced understanding and agreement to the plan.  I spent 35 minutes with the patient discussing the above and reviewing her chart on the same day of the visit.  [place], DO 11/21/20  11:39 AM

## 2020-11-24 ENCOUNTER — Encounter: Payer: Self-pay | Admitting: Internal Medicine

## 2020-12-03 ENCOUNTER — Encounter: Payer: Self-pay | Admitting: Internal Medicine

## 2020-12-05 ENCOUNTER — Ambulatory Visit: Payer: Managed Care, Other (non HMO) | Admitting: Family Medicine

## 2020-12-05 ENCOUNTER — Ambulatory Visit: Payer: Managed Care, Other (non HMO) | Admitting: Internal Medicine

## 2020-12-05 ENCOUNTER — Ambulatory Visit: Payer: Managed Care, Other (non HMO)

## 2020-12-05 ENCOUNTER — Encounter: Payer: Self-pay | Admitting: Gastroenterology

## 2020-12-07 ENCOUNTER — Encounter: Payer: Self-pay | Admitting: Internal Medicine

## 2020-12-07 ENCOUNTER — Other Ambulatory Visit: Payer: Self-pay

## 2020-12-07 ENCOUNTER — Ambulatory Visit: Payer: Managed Care, Other (non HMO)

## 2020-12-07 ENCOUNTER — Ambulatory Visit: Payer: Managed Care, Other (non HMO) | Admitting: Internal Medicine

## 2020-12-07 VITALS — BP 112/80 | HR 88 | Temp 99.1°F | Resp 18 | Ht 62.0 in | Wt 115.8 lb

## 2020-12-07 DIAGNOSIS — K148 Other diseases of tongue: Secondary | ICD-10-CM

## 2020-12-07 DIAGNOSIS — E538 Deficiency of other specified B group vitamins: Secondary | ICD-10-CM | POA: Diagnosis not present

## 2020-12-07 DIAGNOSIS — R634 Abnormal weight loss: Secondary | ICD-10-CM

## 2020-12-07 DIAGNOSIS — E559 Vitamin D deficiency, unspecified: Secondary | ICD-10-CM

## 2020-12-07 DIAGNOSIS — R197 Diarrhea, unspecified: Secondary | ICD-10-CM | POA: Diagnosis not present

## 2020-12-07 LAB — COMPREHENSIVE METABOLIC PANEL
ALT: 15 U/L (ref 0–35)
AST: 20 U/L (ref 0–37)
Albumin: 4.2 g/dL (ref 3.5–5.2)
Alkaline Phosphatase: 81 U/L (ref 39–117)
BUN: 6 mg/dL (ref 6–23)
CO2: 25 mEq/L (ref 19–32)
Calcium: 9.6 mg/dL (ref 8.4–10.5)
Chloride: 99 mEq/L (ref 96–112)
Creatinine, Ser: 0.71 mg/dL (ref 0.40–1.20)
GFR: 103.48 mL/min (ref >60.00)
Glucose, Bld: 89 mg/dL (ref 70–99)
Potassium: 4.3 mEq/L (ref 3.5–5.1)
Sodium: 135 mEq/L (ref 135–145)
Total Bilirubin: 0.3 mg/dL (ref 0.2–1.2)
Total Protein: 7.9 g/dL (ref 6.0–8.3)

## 2020-12-07 LAB — CBC
HCT: 36.3 % (ref 36.0–46.0)
Hemoglobin: 12.4 g/dL (ref 12.0–15.0)
MCHC: 34.1 g/dL (ref 30.0–36.0)
MCV: 80.9 fl (ref 78.0–100.0)
Platelets: 421 10*3/uL — ABNORMAL HIGH (ref 150.0–400.0)
RBC: 4.48 Mil/uL (ref 3.87–5.11)
RDW: 12.1 % (ref 11.5–15.5)
WBC: 6.5 10*3/uL (ref 4.0–10.5)

## 2020-12-07 LAB — VITAMIN B12: Vitamin B-12: 578 pg/mL (ref 211–911)

## 2020-12-07 LAB — TSH: TSH: 1.48 u[IU]/mL (ref 0.35–5.50)

## 2020-12-07 LAB — VITAMIN D 25 HYDROXY (VIT D DEFICIENCY, FRACTURES): VITD: 23.56 ng/mL — ABNORMAL LOW (ref 30.00–100.00)

## 2020-12-07 MED ORDER — RIFAXIMIN 200 MG PO TABS: 200.0000 mg | ORAL_TABLET | Freq: Three times a day (TID) | ORAL | 0 refills | Status: AC

## 2020-12-07 MED ORDER — NYSTATIN 100000 UNIT/ML MT SUSP: 5.0000 mL | Freq: Three times a day (TID) | OROMUCOSAL | 0 refills | Status: DC

## 2020-12-07 NOTE — Patient Instructions (Addendum)
We have sent in xifaxan to take 1 pill 3 times a day for 1 week.  This longue lesion could be lichen planus.  We will try the magic mouth 5 mL 3 times a day for 5 days.  If that does not work we can try some topical steroids and if not responding get you in with a dermatologist.

## 2020-12-07 NOTE — Progress Notes (Signed)
   Subjective:   Patient ID: Shelley Long, female    DOB: 02/01/1976, 45 y.o.   MRN: [id number]  HPI The patient is a 45 YO female coming in for multiple concerns including tongue changes (started some time ago, sought care several weeks ago and virtual visit prescribed 2 week course fluconazole which has helped a little but still persistent, not painful or itching, no tongue swelling and she does not think tongue has changed size in the last few years), and bowels (still with some irregular/loose bowels, had colonoscopy and endoscopy with Dr. Collene Mares beginning of July without polyps, she did have some barrett's esophagus and is taking nexium now, getting bloating and pain and diarrhea, no blood in diarrhea), and weight loss (due to poor appetite and bowel changes down 8 pounds in the last 2 months, unsure if she has stabilized or if this is still decreasing) and needs follow up vitamin deficiencies.     Review of Systems  Constitutional:  Positive for appetite change, fatigue and unexpected weight change.  HENT: Negative.         Tongue problem  Eyes: Negative.   Respiratory:  Negative for cough, chest tightness and shortness of breath.   Cardiovascular:  Negative for chest pain, palpitations and leg swelling.  Gastrointestinal:  Positive for abdominal distention, abdominal pain and diarrhea. Negative for anal bleeding, blood in stool, constipation, nausea and vomiting.  Musculoskeletal: Negative.   Skin: Negative.   Neurological: Negative.   Psychiatric/Behavioral: Negative.     Objective:  Physical Exam Constitutional:      Appearance: She is well-developed.  HENT:     Head: Normocephalic and atraumatic.     Comments: Some white plaques on the tongue more posterior and central Cardiovascular:     Rate and Rhythm: Normal rate and regular rhythm.  Pulmonary:     Effort: Pulmonary effort is normal. No respiratory distress.     Breath sounds: Normal breath sounds. No wheezing or rales.   Abdominal:     General: Bowel sounds are normal. There is no distension.     Palpations: Abdomen is soft.     Tenderness: There is no abdominal tenderness. There is no rebound.  Musculoskeletal:     Cervical back: Normal range of motion.  Skin:    General: Skin is warm and dry.  Neurological:     Mental Status: She is alert and oriented to person, place, and time.     Coordination: Coordination normal.    Vitals:   12/07/20 1047  BP: 112/80  Pulse: 88  Resp: 18  Temp: 99.1 F (37.3 C)  TempSrc: Oral  SpO2: 98%  Weight: 115 lb 12.8 oz (52.5 kg)  Height: '5\' 2"'$  (1.575 m)    This visit occurred during the SARS-CoV-2 public health emergency.  Safety protocols were in place, including screening questions prior to the visit, additional usage of staff PPE, and extensive cleaning of exam room while observing appropriate contact time as indicated for disinfecting solutions.   Assessment & Plan:  Visit time 30 minutes in face to face communication with patient and coordination of care, additional 20 minutes spent in record review, coordination or care, ordering tests, communicating/referring to other healthcare professionals, documenting in medical records all on the same day of the visit for total time 50 minutes spent on the visit.

## 2020-12-08 ENCOUNTER — Ambulatory Visit: Payer: Managed Care, Other (non HMO)

## 2020-12-08 ENCOUNTER — Ambulatory Visit: Payer: Managed Care, Other (non HMO) | Admitting: Internal Medicine

## 2020-12-08 LAB — GLIADIN ANTIBODIES, SERUM
Deamidated Gliadin Abs, IgG: <1 U/mL
Gliadin IgA: <1 U/mL

## 2020-12-08 LAB — TIQ-MISC

## 2020-12-08 LAB — TISSUE TRANSGLUTAMINASE, IGA: (tTG) Ab, IgA: <1 U/mL

## 2020-12-09 DIAGNOSIS — K148 Other diseases of tongue: Secondary | ICD-10-CM | POA: Insufficient documentation

## 2020-12-09 DIAGNOSIS — R634 Abnormal weight loss: Secondary | ICD-10-CM | POA: Insufficient documentation

## 2020-12-09 NOTE — Assessment & Plan Note (Signed)
Checking for celiac disease and TSH. She has had colonoscopy and is up to date on mammogram and age appropriate cancer screenings. Suspect GI loss is the source of weight loss and we are working to stabilize.

## 2020-12-09 NOTE — Assessment & Plan Note (Signed)
Has taken replacement and needs recheck vitamin D level. Adjust as needed.

## 2020-12-09 NOTE — Assessment & Plan Note (Signed)
Checking B12 levels as she has taken shots and is now on oral replacement.

## 2020-12-09 NOTE — Assessment & Plan Note (Addendum)
Refer to GI as she has seen Dr. Ardis Hughs in the past and wanted to see him for this but was unable to get in within a timely fashion so had EGD and colonoscopy with Dr. Collene Mares (records in media) with barrett's but no colon etiology. We talked about SIBO versus IBS versus post-infectious (she has had covid-19 likely twice in the last 6 months). Checking for celiac disease and CBC and CMP and TSH to rule out alternative causes of the diarrhea. Rx 1 week rifaxan.

## 2020-12-09 NOTE — Assessment & Plan Note (Signed)
Given that this did not resolve with 2 weeks of fluconazole it makes pure thrush less likely. In comparison with database pictures lichen planus in the mouth is a possible match to her tongue. We will try nystatin liquid to see if this can resolve her lesion. If not we can try topical corticosteroids and get her in with dermatology.

## 2020-12-11 ENCOUNTER — Encounter: Payer: Self-pay | Admitting: Internal Medicine

## 2020-12-12 ENCOUNTER — Encounter: Payer: Self-pay | Admitting: Internal Medicine

## 2020-12-12 ENCOUNTER — Ambulatory Visit: Payer: Managed Care, Other (non HMO) | Admitting: Internal Medicine

## 2020-12-12 ENCOUNTER — Ambulatory Visit: Payer: Managed Care, Other (non HMO)

## 2020-12-12 ENCOUNTER — Telehealth: Payer: Self-pay | Admitting: Gastroenterology

## 2020-12-12 NOTE — Telephone Encounter (Signed)
She is in good hands with Dr. Collene Mares (EGD and colonoscopy last month) and she should stick with her.

## 2020-12-12 NOTE — Telephone Encounter (Signed)
Good afternoon Dr. Ardis Hughs, we received a referral for patient.  She was previously a patient of yours but recently had a procedure with Dr. Collene Mares.  Records are in Epic under media tab.  Can you please review and advise on scheduling?  Thank you.

## 2020-12-12 NOTE — Telephone Encounter (Signed)
She is Dr. Ardis Hughs.  She called stating she did have the procedure with Dr. Collene Mares but prefers to continue care with you.

## 2020-12-12 NOTE — Telephone Encounter (Signed)
She had a colonoscopy 3 weeks ago with Dr. Collene Mares.  I think she is her patient now.  Is the patient even aware that she is being referred to a different GI doctor than the one she just saw 3 weeks ago?

## 2020-12-13 ENCOUNTER — Encounter: Payer: Self-pay | Admitting: Internal Medicine

## 2020-12-13 DIAGNOSIS — R197 Diarrhea, unspecified: Secondary | ICD-10-CM

## 2020-12-13 NOTE — Telephone Encounter (Signed)
Pt has stated Dr. Elwyn Reach office has informed the pt that they are taking her on as a new pt.  *Pt is asking that a new referral be sent to  Dr. Cherre Robins Fax # - [phone] Phone # - [phone]

## 2020-12-13 NOTE — Telephone Encounter (Signed)
Called and informed patient of Dr. Ardis Hughs recommendation about keeping care with Dr. Collene Mares.  Patient became very upset stating she was unhappy with her care with Dr. Collene Mares.

## 2020-12-19 ENCOUNTER — Telehealth: Payer: Self-pay | Admitting: Internal Medicine

## 2020-12-19 NOTE — Telephone Encounter (Signed)
Would recommend to finish medicine.

## 2020-12-19 NOTE — Telephone Encounter (Signed)
See below

## 2020-12-19 NOTE — Telephone Encounter (Signed)
rifAXIMin 200 mg Oral 3 times daily  Pt is having episodes diarrhea. Yesterday she had 6-7 episodes and today 2 episodes already.  Wonder if she should continue medication?  If she cont. should she take 2 probiotic supplements in addition? Only has 4 days left   Please advise.   Ok'd for MyChart message if she can't be reached on the phone.

## 2020-12-21 ENCOUNTER — Ambulatory Visit: Payer: Managed Care, Other (non HMO)

## 2020-12-21 NOTE — Telephone Encounter (Signed)
Called patient. LVM with Dr. Nathanial Millman instructions. Office number was provided. I

## 2020-12-28 NOTE — Telephone Encounter (Signed)
Hi Dr. Silverio Decamp, this pt called today looking to have a second opinion with you. She stated that she is a friend of your cousin and she suggested that pt sees you. Her records are in [place]. Could you please review them and advise on scheduling? Thank you.

## 2020-12-28 NOTE — Telephone Encounter (Signed)
Reviewed chart in epic, she recently underwent EGD and colonoscopy with Dr Collene Mares last month, is established with her.   We appreciate the interest in our practice, however at this time due to high demand from patients without established GI providers we cannot accommodate this transfer.  Ability to accommodate future transfer requests may change over time and the patient can contact us again in 6-12 months if still interested in being seen at [facility] [place].

## 2020-12-29 NOTE — Telephone Encounter (Signed)
Left message to call back  

## 2020-12-30 ENCOUNTER — Encounter: Payer: Self-pay | Admitting: Internal Medicine

## 2020-12-30 DIAGNOSIS — R197 Diarrhea, unspecified: Secondary | ICD-10-CM

## 2021-01-03 ENCOUNTER — Telehealth: Payer: Self-pay | Admitting: Internal Medicine

## 2021-01-03 NOTE — Telephone Encounter (Signed)
See below

## 2021-01-03 NOTE — Telephone Encounter (Signed)
Patient calling in about Mychart message she sent to provider 09.02.22 (please see below)  Patient says she is extremely concerned about the amount of weight she has lost & is worried it may possibly be cancer  Says GI can not see her until Nov & she needs help before then  Please follow up [phone]

## 2021-01-03 NOTE — Telephone Encounter (Signed)
Can we clarify about the diarrhea (any change since last visit, how many BM per day, any blood in diarrhea), did she take the rifaximin, how much weight change since last visit?

## 2021-01-03 NOTE — Telephone Encounter (Signed)
Pt has stated she has lost 4 lbs since 12/07/2020.  4-6 BM's a day.  Extreme fatigue and knee pain. Pt states she see red spots but does not know if its blood. Pt states she would like the Sibo breath test done.

## 2021-01-04 NOTE — Telephone Encounter (Signed)
See below

## 2021-01-05 NOTE — Telephone Encounter (Signed)
Spoke with the patient to let her know that her message was sent to Dr. [name] and due to [place] being in clinic it may take longer to get a response. She was very appreciative for the update.

## 2021-01-05 NOTE — Telephone Encounter (Signed)
   Patient requesting response regarding SIBO test

## 2021-01-12 ENCOUNTER — Other Ambulatory Visit: Payer: Managed Care, Other (non HMO)

## 2021-01-12 DIAGNOSIS — R197 Diarrhea, unspecified: Secondary | ICD-10-CM

## 2021-01-18 LAB — PANCREATIC ELASTASE, FECAL: Pancreatic Elastase-1, Stool: >500 mcg/g

## 2021-01-23 NOTE — Telephone Encounter (Signed)
Mrs. Labella, Dr. [name] is out of the office until Monday Oct 3rd, but I will forward your email to her desktop for her to address when she return

## 2021-01-31 ENCOUNTER — Ambulatory Visit: Payer: Managed Care, Other (non HMO) | Admitting: Gastroenterology

## 2021-03-03 ENCOUNTER — Ambulatory Visit: Payer: Managed Care, Other (non HMO) | Admitting: Internal Medicine

## 2021-03-06 ENCOUNTER — Encounter: Payer: Self-pay | Admitting: Internal Medicine

## 2021-03-06 ENCOUNTER — Ambulatory Visit: Payer: Managed Care, Other (non HMO) | Admitting: Internal Medicine

## 2021-03-06 ENCOUNTER — Other Ambulatory Visit: Payer: Self-pay

## 2021-03-06 VITALS — BP 118/72 | HR 70 | Resp 18 | Ht 62.0 in | Wt 120.2 lb

## 2021-03-06 DIAGNOSIS — E538 Deficiency of other specified B group vitamins: Secondary | ICD-10-CM | POA: Diagnosis not present

## 2021-03-06 DIAGNOSIS — R634 Abnormal weight loss: Secondary | ICD-10-CM

## 2021-03-06 DIAGNOSIS — R197 Diarrhea, unspecified: Secondary | ICD-10-CM

## 2021-03-06 DIAGNOSIS — Q382 Macroglossia: Secondary | ICD-10-CM

## 2021-03-06 DIAGNOSIS — E559 Vitamin D deficiency, unspecified: Secondary | ICD-10-CM

## 2021-03-06 LAB — VITAMIN B12: Vitamin B-12: 320 pg/mL (ref 211–911)

## 2021-03-06 LAB — VITAMIN D 25 HYDROXY (VIT D DEFICIENCY, FRACTURES): VITD: 14.89 ng/mL — ABNORMAL LOW (ref 30.00–100.00)

## 2021-03-06 LAB — HEMOGLOBIN A1C: Hgb A1c MFr Bld: 5.5 % (ref 4.6–6.5)

## 2021-03-06 NOTE — Assessment & Plan Note (Signed)
Checking vitamin D level and adjust as needed.  

## 2021-03-06 NOTE — Assessment & Plan Note (Signed)
Checking HgA1c and metanephrines (due to diarrhea and anxiety although likely low yield to rule out excessive catecholamines).

## 2021-03-06 NOTE — Assessment & Plan Note (Signed)
Checking metanephrines to rule out excessive catecholamines causing the diarrhea as she has anxiety as well and weight loss. She has had extensive testing and will review candida IgG and IgM however we did discuss how candida in stool is normally colonization and without immune suppression (advanced HIV, cancer undergoing treatment or organ transplant recipient) infection causing diarrhea with candida is very unlikely. Reviewed past testing with her during visit and reviewed past GI notes with recent rifaximin treatment which did improve symptoms overall. Now having only 2-3 loose stools per day in the morning and none throughout the day.

## 2021-03-06 NOTE — Patient Instructions (Signed)
We will check the labs today and I will check on the candida IgG and IgM

## 2021-03-06 NOTE — Progress Notes (Signed)
   Subjective:   Patient ID: Shelley Long, female    DOB: March 03, 1976, 45 y.o.   MRN: [id number]  HPI The patient is a 45 YO female coming in for follow up medical conditions.   Candida IgG and IgM wants to consider ordering  Review of Systems  Constitutional: Negative.   HENT: Negative.    Eyes: Negative.   Respiratory:  Negative for cough, chest tightness and shortness of breath.   Cardiovascular:  Negative for chest pain, palpitations and leg swelling.  Gastrointestinal:  Positive for diarrhea. Negative for abdominal distention, abdominal pain, constipation, nausea and vomiting.  Musculoskeletal: Negative.   Skin: Negative.   Neurological: Negative.   Psychiatric/Behavioral:  The patient is nervous/anxious.    Objective:  Physical Exam Constitutional:      Appearance: She is well-developed.  HENT:     Head: Normocephalic and atraumatic.  Cardiovascular:     Rate and Rhythm: Normal rate and regular rhythm.  Pulmonary:     Effort: Pulmonary effort is normal. No respiratory distress.     Breath sounds: Normal breath sounds. No wheezing or rales.  Abdominal:     General: Bowel sounds are normal. There is no distension.     Palpations: Abdomen is soft.     Tenderness: There is no abdominal tenderness. There is no rebound.  Musculoskeletal:     Cervical back: Normal range of motion.  Skin:    General: Skin is warm and dry.  Neurological:     Mental Status: She is alert and oriented to person, place, and time.     Coordination: Coordination normal.    Vitals:   03/06/21 0833  BP: 118/72  Pulse: 70  Resp: 18  SpO2: 100%  Weight: 120 lb 3.2 oz (54.5 kg)  Height: 5\' 2"  (1.575 m)    This visit occurred during the SARS-CoV-2 public health emergency.  Safety protocols were in place, including screening questions prior to the visit, additional usage of staff PPE, and extensive cleaning of exam room while observing appropriate contact time as indicated for disinfecting  solutions.   Assessment & Plan:

## 2021-03-06 NOTE — Assessment & Plan Note (Signed)
Checking B12 level and adjust as needed.  

## 2021-03-07 ENCOUNTER — Encounter: Payer: Self-pay | Admitting: Internal Medicine

## 2021-03-07 ENCOUNTER — Ambulatory Visit: Payer: Managed Care, Other (non HMO) | Admitting: Internal Medicine

## 2021-03-07 NOTE — Telephone Encounter (Signed)
Patient checking status on orders for labs  Patient states she will be out of town tomorrow and is requesting a lab order for today  Please advise

## 2021-03-13 ENCOUNTER — Other Ambulatory Visit: Payer: Self-pay | Admitting: Internal Medicine

## 2021-03-13 MED ORDER — VITAMIN D (ERGOCALCIFEROL) 1.25 MG (50000 UNIT) PO CAPS: 50000.0000 [IU] | ORAL_CAPSULE | ORAL | 0 refills | Status: DC

## 2021-03-15 ENCOUNTER — Encounter: Payer: Self-pay | Admitting: Internal Medicine

## 2021-03-15 DIAGNOSIS — R5383 Other fatigue: Secondary | ICD-10-CM

## 2021-03-16 ENCOUNTER — Other Ambulatory Visit (INDEPENDENT_AMBULATORY_CARE_PROVIDER_SITE_OTHER): Payer: Managed Care, Other (non HMO)

## 2021-03-16 ENCOUNTER — Other Ambulatory Visit: Payer: Self-pay

## 2021-03-16 DIAGNOSIS — R5383 Other fatigue: Secondary | ICD-10-CM | POA: Diagnosis not present

## 2021-03-16 LAB — MAGNESIUM: Magnesium: 1.8 mg/dL (ref 1.5–2.5)

## 2021-03-16 LAB — FERRITIN: Ferritin: 13.8 ng/mL (ref 10.0–291.0)

## 2021-03-20 ENCOUNTER — Encounter: Payer: Self-pay | Admitting: Internal Medicine

## 2021-03-20 DIAGNOSIS — Q382 Macroglossia: Secondary | ICD-10-CM

## 2021-04-03 ENCOUNTER — Other Ambulatory Visit: Payer: Self-pay | Admitting: Internal Medicine

## 2021-04-03 LAB — TIQ-MISC

## 2021-04-06 LAB — PROTEIN ELECTROPHORESIS, SERUM
Albumin ELP: 4.1 g/dL (ref 3.8–4.8)
Alpha 1: 0.2 g/dL (ref 0.2–0.3)
Alpha 2: 0.5 g/dL (ref 0.5–0.9)
Beta 2: 0.4 g/dL (ref 0.2–0.5)
Beta Globulin: 0.4 g/dL (ref 0.4–0.6)
Gamma Globulin: 1.3 g/dL (ref 0.8–1.7)
Total Protein: 7 g/dL (ref 6.1–8.1)

## 2021-04-07 ENCOUNTER — Encounter: Payer: Self-pay | Admitting: Internal Medicine

## 2021-04-07 DIAGNOSIS — Q382 Macroglossia: Secondary | ICD-10-CM

## 2021-04-11 LAB — PE AND FLC, SERUM

## 2021-04-11 LAB — PROTEIN ELECTROPHORESIS, URINE REFLEX
Albumin ELP, Urine: 0 %
Alpha-1-Globulin, U: 0 %
Alpha-2-Globulin, U: 0 %
Beta Globulin, U: 0 %
Gamma Globulin, U: 0 %
Protein, Ur: 11.6 mg/dL

## 2021-04-13 ENCOUNTER — Other Ambulatory Visit: Payer: Self-pay

## 2021-04-13 ENCOUNTER — Other Ambulatory Visit: Payer: Managed Care, Other (non HMO)

## 2021-04-13 DIAGNOSIS — Q382 Macroglossia: Secondary | ICD-10-CM

## 2021-04-17 LAB — METANEPHRINES, PLASMA
Metanephrine, Free: <25 pg/mL (ref <=57)
Normetanephrine, Free: 67 pg/mL (ref <=148)
Total Metanephrines-Plasma: 67 pg/mL (ref <=205)

## 2021-04-18 ENCOUNTER — Encounter: Payer: Self-pay | Admitting: Internal Medicine

## 2021-04-18 LAB — PE AND FLC, SERUM
A/G Ratio: 1 (ref 0.7–1.7)
Albumin ELP: 2 g/dL — ABNORMAL LOW (ref 2.9–4.4)
Alpha 1: 0.1 g/dL (ref 0.0–0.4)
Alpha 2: 0.4 g/dL (ref 0.4–1.0)
Beta: 0.7 g/dL (ref 0.7–1.3)
Gamma Globulin: 0.8 g/dL (ref 0.4–1.8)
Globulin, Total: 2 g/dL — ABNORMAL LOW (ref 2.2–3.9)
Ig Kappa Free Light Chain: 25.7 mg/L — ABNORMAL HIGH (ref 3.3–19.4)
Ig Lambda Free Light Chain: 16 mg/L (ref 5.7–26.3)
KAPPA/LAMBDA RATIO: 1.61 (ref 0.26–1.65)
Total Protein: 4 g/dL — CL (ref 6.0–8.5)

## 2021-04-18 LAB — PE (RFX IFE+FLC), S
A/G Ratio: 1.2 (ref 0.7–1.7)
Albumin ELP: 3.9 g/dL (ref 2.9–4.4)
Alpha 1: 0.2 g/dL (ref 0.0–0.4)
Alpha 2: 0.6 g/dL (ref 0.4–1.0)
Beta: 1 g/dL (ref 0.7–1.3)
Gamma Globulin: 1.4 g/dL (ref 0.4–1.8)
Globulin, Total: 3.2 g/dL (ref 2.2–3.9)
Total Protein: 7.1 g/dL (ref 6.0–8.5)

## 2021-04-19 ENCOUNTER — Encounter: Payer: Self-pay | Admitting: Internal Medicine

## 2021-05-16 ENCOUNTER — Encounter: Payer: Self-pay | Admitting: Internal Medicine

## 2021-05-16 DIAGNOSIS — E559 Vitamin D deficiency, unspecified: Secondary | ICD-10-CM

## 2021-05-16 DIAGNOSIS — E538 Deficiency of other specified B group vitamins: Secondary | ICD-10-CM

## 2021-05-22 ENCOUNTER — Other Ambulatory Visit: Payer: Self-pay

## 2021-05-22 ENCOUNTER — Other Ambulatory Visit (INDEPENDENT_AMBULATORY_CARE_PROVIDER_SITE_OTHER): Payer: Managed Care, Other (non HMO)

## 2021-05-22 DIAGNOSIS — E559 Vitamin D deficiency, unspecified: Secondary | ICD-10-CM

## 2021-05-22 DIAGNOSIS — E538 Deficiency of other specified B group vitamins: Secondary | ICD-10-CM

## 2021-05-22 LAB — VITAMIN B12: Vitamin B-12: 925 pg/mL — ABNORMAL HIGH (ref 211–911)

## 2021-05-22 LAB — VITAMIN D 25 HYDROXY (VIT D DEFICIENCY, FRACTURES): VITD: 41.76 ng/mL (ref 30.00–100.00)

## 2021-05-22 NOTE — Addendum Note (Signed)
Addended by: Pricilla Holm A on: 05/22/2021 10:04 AM   Modules accepted: Orders

## 2021-06-07 ENCOUNTER — Encounter: Payer: Self-pay | Admitting: Internal Medicine

## 2021-06-13 ENCOUNTER — Encounter: Payer: Self-pay | Admitting: Internal Medicine

## 2021-06-28 ENCOUNTER — Encounter: Payer: Self-pay | Admitting: Internal Medicine

## 2021-07-04 ENCOUNTER — Encounter: Payer: Self-pay | Admitting: Internal Medicine

## 2021-07-04 DIAGNOSIS — R634 Abnormal weight loss: Secondary | ICD-10-CM

## 2021-07-04 DIAGNOSIS — R197 Diarrhea, unspecified: Secondary | ICD-10-CM

## 2021-07-12 NOTE — Telephone Encounter (Signed)
Pt called in stating [facility] has not received referral ? ?Contacted Katina on the referral team, provided fax number pt provided for [facility],  Alwyn Ren states she will refax referral today 3-15 ?

## 2021-07-13 ENCOUNTER — Encounter: Payer: Self-pay | Admitting: Internal Medicine

## 2021-07-14 ENCOUNTER — Encounter: Payer: Self-pay | Admitting: Internal Medicine

## 2021-07-14 DIAGNOSIS — R197 Diarrhea, unspecified: Secondary | ICD-10-CM

## 2021-07-14 DIAGNOSIS — E538 Deficiency of other specified B group vitamins: Secondary | ICD-10-CM

## 2021-07-14 DIAGNOSIS — E559 Vitamin D deficiency, unspecified: Secondary | ICD-10-CM

## 2021-07-18 ENCOUNTER — Other Ambulatory Visit (INDEPENDENT_AMBULATORY_CARE_PROVIDER_SITE_OTHER): Payer: BC Managed Care – PPO

## 2021-07-18 ENCOUNTER — Other Ambulatory Visit: Payer: Self-pay

## 2021-07-18 DIAGNOSIS — E559 Vitamin D deficiency, unspecified: Secondary | ICD-10-CM

## 2021-07-18 DIAGNOSIS — E538 Deficiency of other specified B group vitamins: Secondary | ICD-10-CM

## 2021-07-18 DIAGNOSIS — R197 Diarrhea, unspecified: Secondary | ICD-10-CM

## 2021-07-18 LAB — VITAMIN B12: Vitamin B-12: 613 pg/mL (ref 211–911)

## 2021-07-18 LAB — COMPREHENSIVE METABOLIC PANEL
ALT: 23 U/L (ref 0–35)
AST: 26 U/L (ref 0–37)
Albumin: 4.2 g/dL (ref 3.5–5.2)
Alkaline Phosphatase: 71 U/L (ref 39–117)
BUN: 10 mg/dL (ref 6–23)
CO2: 27 mEq/L (ref 19–32)
Calcium: 9.4 mg/dL (ref 8.4–10.5)
Chloride: 101 mEq/L (ref 96–112)
Creatinine, Ser: 0.66 mg/dL (ref 0.40–1.20)
GFR: 106.3 mL/min (ref >60.00)
Glucose, Bld: 90 mg/dL (ref 70–99)
Potassium: 3.8 mEq/L (ref 3.5–5.1)
Sodium: 136 mEq/L (ref 135–145)
Total Bilirubin: 0.4 mg/dL (ref 0.2–1.2)
Total Protein: 7.2 g/dL (ref 6.0–8.3)

## 2021-07-18 LAB — CBC
HCT: 34.3 % — ABNORMAL LOW (ref 36.0–46.0)
Hemoglobin: 11.8 g/dL — ABNORMAL LOW (ref 12.0–15.0)
MCHC: 34.5 g/dL (ref 30.0–36.0)
MCV: 81.8 fl (ref 78.0–100.0)
Platelets: 353 10*3/uL (ref 150.0–400.0)
RBC: 4.19 Mil/uL (ref 3.87–5.11)
RDW: 12.7 % (ref 11.5–15.5)
WBC: 5.1 10*3/uL (ref 4.0–10.5)

## 2021-07-18 LAB — VITAMIN D 25 HYDROXY (VIT D DEFICIENCY, FRACTURES): VITD: 29.94 ng/mL — ABNORMAL LOW (ref 30.00–100.00)

## 2021-07-18 LAB — FERRITIN: Ferritin: 11.8 ng/mL (ref 10.0–291.0)

## 2021-07-27 ENCOUNTER — Encounter: Payer: Managed Care, Other (non HMO) | Admitting: Internal Medicine

## 2021-08-15 NOTE — Progress Notes (Signed)
? ?NEW PATIENT ?Date of Service/Encounter:  08/16/21 ?Referring provider: Hoyt Koch, * ?Primary care provider: Hoyt Koch, MD ? ?Subjective:  ?Shelley Long is a 46 y.o. female with a PMHx of vitamin D deficiency, B12 deficiency, presenting today for evaluation of chronic rhinitis. ?History obtained from: chart review and patient. ?  ?Concern for food allergies:  ?Almost one year ago, symptoms started in April/May 2022, hsa gone on for over a year --> diarrhea, lost 20 lbs,  ?She is going back to see GI next week, has been given immodium which is not working well. ?Has had endoscopy, colonscopy, but no other issues ?Tongue wakes up and is swollen, not sure if acid reflux, seasonal allergies, food allergies ? ?Dairy makes it worse, but does not resolve symptoms when she eliminates from her diet ? ?Colonscopy 11/04/20-read as normal ?Biopsy of esophagus-Barrett esophagus, no eosinophils ?Biopsy of colon-normal ? ?Has history of recurrent breast, ovarian and pancreatic concern in her family. Has not had BRCA testing.  ? ?Chronic rhinitis: started when she moved from [place] to [place] 6 years ago ?Symptoms include: itchy eyes, PND,   ?Occurs year-round with seasonal flares in Spring ?Treatments tried: has taken zyrtec in past, but none this year ?Previous allergy testing: no ?History of reflux/heartburn: yes, but not able to take nexium as this caused SIBO ? ? ?Past Medical History: ?Past Medical History:  ?Diagnosis Date  ? Abdominal pain   ? Delivery by vacuum extractor affecting fetus or newborn 11/30/2011  ? Diastasis recti s/p primary closure/repair 11/13/2010  ? Hx of ectopic pregnancy   ? Normal pregnancy 11/30/2011  ? Skin moles   ? abnormal per medical history form dated 11/13/10.  ? ?Medication List:  ?Current Outpatient Medications  ?Medication Sig Dispense Refill  ? cyanocobalamin 1000 MCG tablet Take 1 tablet by mouth daily.    ? Digestive Enzymes (BETAINE HCL PO) Take 640 mg by  mouth daily.    ? esomeprazole (NEXIUM) 40 MG packet Take 40 mg by mouth daily.    ? Ginger, Zingiber officinalis, (GINGER PO) Take 3 tablets by mouth at bedtime.    ? Probiotic Product (PROBIOTIC DAILY PO) Take 1 tablet by mouth daily.    ? Vitamin D, Ergocalciferol, (DRISDOL) 1.25 MG (50000 UNIT) CAPS capsule Take 1 capsule (50,000 Units total) by mouth every 7 (seven) days. 12 capsule 0  ? ?No current facility-administered medications for this visit.  ? ?Known Allergies:  ?No Known Allergies ?Past Surgical History: ?Past Surgical History:  ?Procedure Laterality Date  ? CESAREAN SECTION    ? x 1   ? ECTOPIC PREGNANCY SURGERY  2010  ? HERNIA REPAIR  04/02/12  ? lap ventral hernia repair  ? VENTRAL HERNIA REPAIR  04/02/2012  ? ?Family History: ?Family History  ?Problem Relation Age of Onset  ? Cancer Mother   ?     breast - stage 0  ? Breast cancer Mother   ? Cancer Maternal Aunt   ?     breast cancer  ? Breast cancer Maternal Aunt   ? Cancer Maternal Aunt   ?     ovarian  ? Breast cancer Maternal Aunt   ? Cancer Maternal Grandmother   ?     breast or uterine  ? Colon cancer Neg Hx   ? Other Neg Hx   ? ?Social History: Milanie lives in a house built 7 years ago, wood floors, central AC, no pets, no cockroaches, no dust mite protection  on the bedding, no smoke exposure.  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  [place]  ? ?ROS:  ?All other systems negative except as noted per HPI. ? ?Objective:  ?Blood pressure 116/74, pulse 78, temperature 98.7 ?F (37.1 ?C), resp. rate 16, height 5' 1.5" (1.562 m), weight 116 lb (52.6 kg), SpO2 100 %. ?Body mass index is 21.56 kg/m?Marland Kitchen ?Physical Exam: ? ?General Appearance:  Alert, cooperative, no distress, appears stated age  ?Head:  Normocephalic, without obvious abnormality, atraumatic  ?Eyes:  Conjunctiva clear, EOM's intact  ?Nose: Nares normal, no notable rhinorrhea  ?Throat: Lips,  tongue normal; teeth and gums normal,  ?Neck: Supple, symmetrical  ?Lungs:   Respirations unlabored, no coughing  ?Heart:  Appears well perfused  ?Extremities: No edema  ?Skin: Skin color, texture, turgor normal, no rashes or lesions on visualized portions of skin  ?Neurologic: No gross deficits  ? ? ? ?Diagnostics: ?Skin Testing: Environmental allergy panel and select foods. ? Adequate controls. ?Results discussed with patient/family. ? Airborne Adult Perc - 08/16/21 1116   ? ? Time Antigen Placed 1100   ? Allergen Manufacturer Lavella Hammock   ? Location Back   ? Number of Test 59   ? 1. Control-Buffer 50% Glycerol Negative   ? 2. Control-Histamine 1 mg/ml 3+   ? 3. Albumin saline Negative   ? 4. [place] Negative   ? 5. [place] 2+   ? 7. Worley Blue Negative   ? 8. Meadow Fescue Negative   ? 9. Perennial Rye Negative   ? 10. Sweet Vernal Negative   ? 11. Timothy Negative   ? 12. Cocklebur Negative   ? 13. Burweed Marshelder Negative   ? 14. Ragweed, short Negative   ? 15. Ragweed, Giant Negative   ? 16. Plantain,  English Negative   ? 17. Lamb's Quarters Negative   ? 18. Sheep Sorrell Negative   ? 19. Rough Pigweed Negative   ? 20. Marsh Elder, Rough 3+   ? 21. Mugwort, Common Negative   ? 22. Ash mix Negative   ? 23. Birch mix 3+   ? 24. Beech American 3+   ? 25. Box, Elder 3+   ? 26. Cedar, red Negative   ? 27. Cottonwood, [place] Negative   ? 28. Elm mix Negative   ? 29. Hickory 3+   ? 30. Maple mix Negative   ? 31. Oak, [place] mix Negative   ? 32. Pecan Pollen 4+   ? 33. Pine mix Negative   ? 34. Sycamore Eastern Negative   ? 35. Walnut, Black Pollen Negative   ? 36. Alternaria alternata Negative   ? 96. Cladosporium Herbarum Negative   ? 38. Aspergillus mix Negative   ? 39. Penicillium mix Negative   ? 40. Bipolaris sorokiniana (Helminthosporium) 2+   ? 41. Drechslera spicifera (Curvularia) Negative   ? 42. Mucor plumbeus Negative   ? 43. Fusarium moniliforme Negative   ? 44. Aureobasidium pullulans (pullulara)  Negative   ? 45. Rhizopus oryzae Negative   ? 46. Botrytis cinera Negative   ? 47. Epicoccum nigrum Negative   ? 48. Phoma betae Negative   ? 49. Candida Albicans Negative   ? 50. Trichophyton mentagrophytes Negative   ? 51. Mite, D Farinae  5,000 AU/ml Negative   ? 52. Mite, D Pteronyssinus  5,000 AU/ml Negative   ? 53. Cat Hair 10,000 BAU/ml Negative   ?  54.  Dog Epithelia Negative   ? 55. Mixed Feathers Negative   ? 56. Horse Epithelia Negative   ? 57. Cockroach, German 3+   ? 58. Mouse 2+   ? 59. Tobacco Leaf Negative   ? ?  ?  ? ?  ? ? Food Adult Perc - 08/16/21 1100   ? ? Time Antigen Placed 1100   ? Allergen Manufacturer Lavella Hammock   ? Location Back   ? Number of allergen test 72   ? 1. Peanut Negative   ? 2. Soybean Negative   ? 3. Wheat Negative   ? 4. Sesame Negative   ? 5. Milk, cow Negative   ? 6. Egg White, Chicken Negative   ? 7. Casein Negative   ? 8. Shellfish Mix Negative   ? 9. Fish Mix Negative   ? 10. Cashew Negative   ? 11. Pecan Food Negative   ? 12. Dauphin Negative   ? 13. Almond Negative   ? 14. Hazelnut Negative   ? 15. [place] nut Negative   ? 16. Coconut Negative   ? 17. Pistachio Negative   ? 18. Catfish Negative   ? 19. Bass Negative   ? 20. Trout Negative   ? 21. Tuna Negative   ? 22. Salmon Negative   ? 23. Flounder Negative   ? 24. Codfish Negative   ? 25. Shrimp Negative   ? 26. Crab Negative   ? 27. Lobster Negative   ? 28. Oyster Negative   ? 29. Scallops Negative   ? 30. Barley Negative   ? 31. Oat  Negative   ? 32. Rye  Negative   ? 33. Hops Negative   ? 34. Rice Negative   ? 35. Cottonseed Negative   ? 36. Saccharomyces Cerevisiae  Negative   ? 55. Pork Negative   ? 38. [place] Meat Negative   ? 39. Chicken Meat Negative   ? 40. Beef Negative   ? 41. Lamb Negative   ? 42. Tomato Negative   ? 43. White Potato Negative   ? 44. Sweet Potato Negative   ? 45. Pea, Green/English Negative   ? 46. Navy Bean Negative   ? 47. Mushrooms Negative   ? 48. Avocado Negative   ? 49. Onion  Negative   ? 50. Cabbage Negative   ? 51. Carrots Negative   ? 52. Celery Negative   ? 53. Corn Negative   ? 54. Cucumber Negative   ? 55. Grape (White seedless) Negative   ? 56. Orange  Negative   ? 57. Banana Negative

## 2021-08-16 ENCOUNTER — Encounter: Payer: Self-pay | Admitting: Internal Medicine

## 2021-08-16 ENCOUNTER — Ambulatory Visit (INDEPENDENT_AMBULATORY_CARE_PROVIDER_SITE_OTHER): Payer: BC Managed Care – PPO | Admitting: Internal Medicine

## 2021-08-16 VITALS — BP 116/74 | HR 78 | Temp 98.7°F | Resp 16 | Ht 61.5 in | Wt 116.0 lb

## 2021-08-16 DIAGNOSIS — J310 Chronic rhinitis: Secondary | ICD-10-CM

## 2021-08-16 DIAGNOSIS — K529 Noninfective gastroenteritis and colitis, unspecified: Secondary | ICD-10-CM | POA: Insufficient documentation

## 2021-08-16 DIAGNOSIS — T781XXA Other adverse food reactions, not elsewhere classified, initial encounter: Secondary | ICD-10-CM

## 2021-08-16 DIAGNOSIS — H1013 Acute atopic conjunctivitis, bilateral: Secondary | ICD-10-CM | POA: Diagnosis not present

## 2021-08-16 DIAGNOSIS — J3089 Other allergic rhinitis: Secondary | ICD-10-CM

## 2021-08-16 DIAGNOSIS — J302 Other seasonal allergic rhinitis: Secondary | ICD-10-CM

## 2021-08-16 NOTE — Patient Instructions (Addendum)
Allergic Rhinitis -seasonal and perennial: ?- allergy testing today was positive to grass pollen ([place]), weed pollen (rough marsh elder), tree pollens( birch, beech, box elder, hickory, pecan), mold (helminthosporum), cockroach and mice ?- allergen avoidance as below ? ?- Consider Nasal Steroid Spray: Options include Flonase (fluticasone), Nasocort (triamcinolone), Nasonex (mometasome) 1- 2 sprays in each nostril daily (can buy over-the-counter if not covered by insurance)  Best results if used daily. ?- Consider Astelin (Azelastine) 1-2 sprays in each nostril twice a day as needed.  You may use this as needed for nasal congestion/itchy ears/itchy nose if desired (over the counter) ? ?-  It the above is not enough, consider  over the counter antihistamine daily or daily as needed.   ?-Your options include Zyrtec (Cetirizine) 76m, Claritin (Loratadine) 19m Allegra (Fexofenadine) 18010mor Xyzal (Levocetirinze) 5mg46m?Allergic Conjunctivitis:  ?- Consider Allergy Eye drops: great options include Pataday (Olopatadine) or Zaditor (ketotifen) for eye symptoms daily as needed-both sold over the counter if not covered by insurance.   ?-Avoid eye drops that say red eye relief as they may contain medications that dry out your eyes. ? ?Chronic GI symptoms:  ?- food allergy unlikely based on history ?- skin testing to food panel negative ?- consider food elimination diet ?- agree with return to GI specialist ?- labs: tryptase, C4 (screen for mast cell disorders which can cause diarrhea and screen for forms of tissue swelling which can manifest in the bowel) ?- strongly consider BRCA testing ? ?It was a pleasure meeting you today! ?We can follow-up yearly or as needed.   ?

## 2021-08-18 ENCOUNTER — Telehealth: Payer: Self-pay

## 2021-08-18 LAB — C4 COMPLEMENT: Complement C4, Serum: 65 mg/dL — ABNORMAL HIGH (ref 12–38)

## 2021-08-18 LAB — TRYPTASE: Tryptase: 5.4 ug/L (ref 2.2–13.2)

## 2021-08-18 NOTE — Telephone Encounter (Signed)
Spoke with the pt and she has been scheduled for 08/22/2021 at 2:40 pm. Pt is aware appt date and time  ?

## 2021-08-18 NOTE — Telephone Encounter (Signed)
Pt is requesting a MRI of her abdomen after seeing her Allergist. Pt was advised that C4 Complement was elevated and that could be a indicator of cancer. Also she states that Allergist advised her to look into getting a BRCA genetic testing with her GYN. ? ?Pt is very concerned the the weight loss, Diarrhea, and now the results that she was given. ?  ?Please advise  ?

## 2021-08-18 NOTE — Telephone Encounter (Signed)
She will need visit to discuss. Typically MRI is not initial imaging of abdomen if imaging is needed.  ?

## 2021-08-22 ENCOUNTER — Ambulatory Visit: Payer: Self-pay | Admitting: Internal Medicine

## 2021-08-28 ENCOUNTER — Telehealth: Payer: Self-pay | Admitting: Internal Medicine

## 2021-08-28 NOTE — Telephone Encounter (Signed)
Most of those labs were just checked end of March and normal so will order as appropriate at CPE and can be done after if needed. ?

## 2021-08-28 NOTE — Telephone Encounter (Signed)
See my chart message

## 2021-08-28 NOTE — Telephone Encounter (Signed)
Pt requesting order for labs prior to cpe appt on 09-21-2021 ? ?Pt requesting labs to include; iron, thyroid, b12d, and eos panel ? ? ?

## 2021-09-19 ENCOUNTER — Encounter: Payer: Self-pay | Admitting: Internal Medicine

## 2021-09-21 ENCOUNTER — Encounter: Payer: Self-pay | Admitting: Internal Medicine

## 2021-09-28 ENCOUNTER — Ambulatory Visit (INDEPENDENT_AMBULATORY_CARE_PROVIDER_SITE_OTHER): Payer: Managed Care, Other (non HMO) | Admitting: Internal Medicine

## 2021-09-28 ENCOUNTER — Encounter: Payer: Self-pay | Admitting: Internal Medicine

## 2021-09-28 VITALS — BP 114/60 | HR 67 | Resp 18 | Ht 61.5 in | Wt 118.5 lb

## 2021-09-28 DIAGNOSIS — E559 Vitamin D deficiency, unspecified: Secondary | ICD-10-CM | POA: Diagnosis not present

## 2021-09-28 DIAGNOSIS — Z0000 Encounter for general adult medical examination without abnormal findings: Secondary | ICD-10-CM

## 2021-09-28 DIAGNOSIS — E538 Deficiency of other specified B group vitamins: Secondary | ICD-10-CM

## 2021-09-28 DIAGNOSIS — K529 Noninfective gastroenteritis and colitis, unspecified: Secondary | ICD-10-CM | POA: Diagnosis not present

## 2021-09-28 LAB — FERRITIN: Ferritin: 6.2 ng/mL — ABNORMAL LOW (ref 10.0–291.0)

## 2021-09-28 LAB — CBC WITH DIFFERENTIAL/PLATELET
Basophils Absolute: 0 10*3/uL (ref 0.0–0.1)
Basophils Relative: 0.5 % (ref 0.0–3.0)
Eosinophils Absolute: 0.2 10*3/uL (ref 0.0–0.7)
Eosinophils Relative: 3.1 % (ref 0.0–5.0)
HCT: 34.4 % — ABNORMAL LOW (ref 36.0–46.0)
Hemoglobin: 11.7 g/dL — ABNORMAL LOW (ref 12.0–15.0)
Lymphocytes Relative: 25.7 % (ref 12.0–46.0)
Lymphs Abs: 1.3 10*3/uL (ref 0.7–4.0)
MCHC: 33.9 g/dL (ref 30.0–36.0)
MCV: 83.2 fl (ref 78.0–100.0)
Monocytes Absolute: 0.4 10*3/uL (ref 0.1–1.0)
Monocytes Relative: 7.8 % (ref 3.0–12.0)
Neutro Abs: 3.2 10*3/uL (ref 1.4–7.7)
Neutrophils Relative %: 62.9 % (ref 43.0–77.0)
Platelets: 322 10*3/uL (ref 150.0–400.0)
RBC: 4.14 Mil/uL (ref 3.87–5.11)
RDW: 13.2 % (ref 11.5–15.5)
WBC: 5.1 10*3/uL (ref 4.0–10.5)

## 2021-09-28 LAB — T4, FREE: Free T4: 0.95 ng/dL (ref 0.60–1.60)

## 2021-09-28 LAB — COMPREHENSIVE METABOLIC PANEL
ALT: 21 U/L (ref 0–35)
AST: 21 U/L (ref 0–37)
Albumin: 4 g/dL (ref 3.5–5.2)
Alkaline Phosphatase: 60 U/L (ref 39–117)
BUN: 7 mg/dL (ref 6–23)
CO2: 25 mEq/L (ref 19–32)
Calcium: 9.3 mg/dL (ref 8.4–10.5)
Chloride: 103 mEq/L (ref 96–112)
Creatinine, Ser: 0.75 mg/dL (ref 0.40–1.20)
GFR: 96.34 mL/min (ref >60.00)
Glucose, Bld: 88 mg/dL (ref 70–99)
Potassium: 4.3 mEq/L (ref 3.5–5.1)
Sodium: 137 mEq/L (ref 135–145)
Total Bilirubin: 0.4 mg/dL (ref 0.2–1.2)
Total Protein: 7 g/dL (ref 6.0–8.3)

## 2021-09-28 LAB — LIPID PANEL
Cholesterol: 229 mg/dL — ABNORMAL HIGH (ref 0–200)
HDL: 78.6 mg/dL (ref >39.00)
LDL Cholesterol: 137 mg/dL — ABNORMAL HIGH (ref 0–99)
NonHDL: 150.71
Total CHOL/HDL Ratio: 3
Triglycerides: 69 mg/dL (ref 0.0–149.0)
VLDL: 13.8 mg/dL (ref 0.0–40.0)

## 2021-09-28 LAB — HEMOGLOBIN A1C: Hgb A1c MFr Bld: 5.5 % (ref 4.6–6.5)

## 2021-09-28 LAB — VITAMIN B12: Vitamin B-12: 945 pg/mL — ABNORMAL HIGH (ref 211–911)

## 2021-09-28 LAB — VITAMIN D 25 HYDROXY (VIT D DEFICIENCY, FRACTURES): VITD: 23.79 ng/mL — ABNORMAL LOW (ref 30.00–100.00)

## 2021-09-28 LAB — FOLATE: Folate: >24.2 ng/mL (ref >5.9)

## 2021-09-28 LAB — TSH: TSH: 1.2 u[IU]/mL (ref 0.35–5.50)

## 2021-09-28 NOTE — Assessment & Plan Note (Signed)
Checking B12 and vitamin D and thyroid and CBC and CMP and fatty acids to assess nutritional status.

## 2021-09-28 NOTE — Assessment & Plan Note (Signed)
Checking vitamin D and adjust as needed. 

## 2021-09-28 NOTE — Assessment & Plan Note (Signed)
Flu shot yearly. Covid-19 counseled. Tetanus up to date. Colonoscopy up to date. Mammogram up to date, pap smear up to date. Counseled about sun safety and mole surveillance. Counseled about the dangers of distracted driving. Given 10 year screening recommendations.   

## 2021-09-28 NOTE — Assessment & Plan Note (Signed)
Checking B12 levels and adjust as needed. Taking oral.

## 2021-09-28 NOTE — Progress Notes (Signed)
   Subjective:   Patient ID: Shelley Long, female    DOB: 10/11/1975, 46 y.o.   MRN: [id number]  HPI The patient is a 46 YO female coming in for physical. Finally less diarrhea and is stable weight still feels low on nutrition.  PMH, [facility], social history reviewed and updated  Review of Systems  Constitutional:  Positive for activity change and fatigue.  HENT: Negative.    Eyes: Negative.   Respiratory:  Negative for cough, chest tightness and shortness of breath.   Cardiovascular:  Negative for chest pain, palpitations and leg swelling.  Gastrointestinal:  Positive for diarrhea. Negative for abdominal distention, abdominal pain, constipation, nausea and vomiting.  Musculoskeletal: Negative.   Skin: Negative.   Neurological: Negative.   Psychiatric/Behavioral: Negative.     Objective:  Physical Exam Constitutional:      Appearance: She is well-developed.  HENT:     Head: Normocephalic and atraumatic.  Cardiovascular:     Rate and Rhythm: Normal rate and regular rhythm.  Pulmonary:     Effort: Pulmonary effort is normal. No respiratory distress.     Breath sounds: Normal breath sounds. No wheezing or rales.  Abdominal:     General: Bowel sounds are normal. There is no distension.     Palpations: Abdomen is soft.     Tenderness: There is no abdominal tenderness. There is no rebound.  Musculoskeletal:     Cervical back: Normal range of motion.  Skin:    General: Skin is warm and dry.  Neurological:     Mental Status: She is alert and oriented to person, place, and time.     Coordination: Coordination normal.    Vitals:   09/28/21 0922  BP: 114/60  Pulse: 67  Resp: 18  SpO2: 99%  Weight: 118 lb 8 oz (53.8 kg)  Height: 5' 1.5" (1.562 m)    Assessment & Plan:

## 2021-10-06 ENCOUNTER — Other Ambulatory Visit: Payer: Self-pay | Admitting: Internal Medicine

## 2021-10-06 DIAGNOSIS — E611 Iron deficiency: Secondary | ICD-10-CM

## 2021-10-06 LAB — FATTY ACID PROFILE, ESSENTIAL
Arachidic Acid, C20:0: 30 nmol/mL (ref 8–43)
Arachidonic Acid, C20:4w6: 583 nmol/mL (ref 310–1420)
DHA, C22:6w3: 199 nmol/mL (ref 45–365)
DPA, C22:5w3: 53 nmol/mL (ref 13–75)
DPA, C22:5w6: 10 nmol/mL (ref 6–55)
DTA, C22:4w6: 10 nmol/mL (ref 10–40)
Docosenoic Acid, C22: 4 nmol/mL (ref 1–10)
EPA, C20:5w3: 120 nmol/mL (ref 8–130)
Hexadecenoic Acid, C16:1w9: 50 nmol/mL (ref 14–95)
IMAGE: 0
Interp, Fatty Acids Profile SP: NORMAL
Lauric Acid, C12:0: 7 nmol/mL (ref 1–200)
Linoleic Acid, C18:2w6: 4383 nmol/mL — ABNORMAL HIGH (ref 1210–4300)
Mead Acid, C20:3w9: 4 nmol/mL (ref 1–35)
Myristic Acid, C14:0: 87 nmol/mL (ref 20–520)
Nervonic Acid, C24:1w9: 144 nmol/mL (ref 35–145)
Oleic Acid, C18:1w9: 2068 nmol/mL (ref 740–3900)
Palmitic Acid, C16:0: 2536 nmol/mL (ref 1090–3840)
Palmitoleic Acid, C16:1w7: 129 nmol/mL (ref 35–580)
Stearic Acid, C18:0: 805 nmol/mL (ref 280–1250)
Total Fatty Acids: 11.6 mmol/L (ref 4.5–15.0)
Total Monounsaturated Acid: 2.6 mmol/L (ref 0.9–4.7)
Total Polyunsaturated Ac: 5.6 mmol/L (ref 2.1–6.2)
Total Saturated Acid: 3.5 mmol/L (ref 1.5–5.3)
Total w3: 0.45 mmol/L (ref 0.12–0.55)
Total w6: 5.2 mmol/L (ref 1.8–5.7)
Triene Tetraene Ratio: 0.007 (ref 0.004–0.051)
Vaccenic Acid, C18:1w7: 160 nmol/mL (ref 50–250)
a-Linolenic Acid, C18:3w3: 74 nmol/mL (ref 20–200)
g-Linolenic Acid, C18:3w6: 35 nmol/mL (ref 10–120)
h-g-Linolenic C20:3w6: 139 nmol/mL (ref 45–340)

## 2021-10-06 MED ORDER — VITAMIN D (ERGOCALCIFEROL) 1.25 MG (50000 UNIT) PO CAPS: 50000.0000 [IU] | ORAL_CAPSULE | ORAL | 3 refills | Status: DC

## 2021-10-10 ENCOUNTER — Telehealth: Payer: Self-pay | Admitting: Pharmacy Technician

## 2021-10-10 NOTE — Telephone Encounter (Signed)
Ordered venofer instead and canceled feraheme

## 2021-10-10 NOTE — Telephone Encounter (Signed)
Dr. [name],  Shelley Long is a non-preferred medication and will be denied due to patient has not tried and or failed step therapy. Venofer Infed Ferrlecit Would you like to try venofer??? Please advise.  Auth Submission: Pending Payer: [employer] Medication & CPT/J Code(s) submitted: Feraheme (ferumoxytol) [id number] Route of submission (phone, fax, portal): phone: [phone] Auth type: Buy/Bill Units/visits requested:  Reference number: [id number] 10/10/21 '@11'$ ;30 Approval from:  to  at [place] as    Will update once we receive a response.

## 2021-10-12 ENCOUNTER — Ambulatory Visit (INDEPENDENT_AMBULATORY_CARE_PROVIDER_SITE_OTHER): Payer: Managed Care, Other (non HMO)

## 2021-10-12 ENCOUNTER — Other Ambulatory Visit: Payer: Self-pay | Admitting: Obstetrics and Gynecology

## 2021-10-12 VITALS — BP 102/66 | HR 84 | Temp 98.9°F | Resp 16 | Ht 60.0 in | Wt 123.6 lb

## 2021-10-12 DIAGNOSIS — E611 Iron deficiency: Secondary | ICD-10-CM | POA: Diagnosis not present

## 2021-10-12 DIAGNOSIS — Z1231 Encounter for screening mammogram for malignant neoplasm of breast: Secondary | ICD-10-CM

## 2021-10-12 MED ORDER — SODIUM CHLORIDE 0.9 % IV SOLN
200.0000 mg | Freq: Once | INTRAVENOUS | Status: AC
  Administered 2021-10-12: 200 mg via INTRAVENOUS
  Filled 2021-10-12: qty 10

## 2021-10-12 NOTE — Progress Notes (Signed)
Diagnosis: Iron Deficiency Anemia  Provider:  Marshell Garfinkel, MD  Procedure: Infusion  IV Type: Peripheral, IV Location: R Antecubital  Venofer (Iron Sucrose), Dose: 200 mg  Infusion Start Time: 2330  Infusion Stop Time: 1010  Post Infusion IV Care: Observation period completed  Discharge: Condition: Good, Destination: Home . AVS provided to patient.   Performed by:  Paul Dykes, RN

## 2021-10-16 ENCOUNTER — Ambulatory Visit (INDEPENDENT_AMBULATORY_CARE_PROVIDER_SITE_OTHER): Payer: Managed Care, Other (non HMO)

## 2021-10-16 ENCOUNTER — Encounter: Payer: Self-pay | Admitting: Internal Medicine

## 2021-10-16 VITALS — BP 91/61 | HR 75 | Temp 98.4°F | Resp 18 | Ht 60.0 in | Wt 123.4 lb

## 2021-10-16 DIAGNOSIS — E611 Iron deficiency: Secondary | ICD-10-CM

## 2021-10-16 MED ORDER — SODIUM CHLORIDE 0.9 % IV SOLN
200.0000 mg | Freq: Once | INTRAVENOUS | Status: AC
  Administered 2021-10-16: 200 mg via INTRAVENOUS
  Filled 2021-10-16: qty 10

## 2021-10-16 NOTE — Progress Notes (Signed)
Diagnosis: Iron Deficiency Anemia  Provider:  Marshell Garfinkel, MD  Procedure: Infusion  IV Type: Peripheral, IV Location: R Antecubital  Venofer (Iron Sucrose), Dose: 200 mg  Infusion Start Time: 1430  Infusion Stop Time: 2330  Post Infusion IV Care: Peripheral IV Discontinued  Discharge: Condition: Good, Destination: Home . AVS provided to patient.   Performed by:  Arnoldo Morale, RN

## 2021-10-17 NOTE — Telephone Encounter (Signed)
Patient is wondering if due to her lab work, she is anemic? She is also wanting to clarify if dark urine is normal after getting the infusions?  Requesting to recheck iron and ferritin after her treatments.

## 2021-10-18 ENCOUNTER — Encounter: Payer: Self-pay | Admitting: Internal Medicine

## 2021-10-18 ENCOUNTER — Ambulatory Visit: Payer: Managed Care, Other (non HMO)

## 2021-10-18 MED ORDER — SODIUM CHLORIDE 0.9 % IV SOLN
200.0000 mg | Freq: Once | INTRAVENOUS | Status: DC
  Filled 2021-10-18: qty 10

## 2021-10-18 NOTE — Progress Notes (Signed)
Patient states she had redness to injection site and arm after infusion on Monday 10/16/2021. Wants to speak with doctor before continuing treatments.

## 2021-10-20 ENCOUNTER — Ambulatory Visit: Payer: Managed Care, Other (non HMO) | Admitting: Internal Medicine

## 2021-10-20 ENCOUNTER — Ambulatory Visit: Payer: Managed Care, Other (non HMO)

## 2021-10-23 ENCOUNTER — Ambulatory Visit (INDEPENDENT_AMBULATORY_CARE_PROVIDER_SITE_OTHER): Payer: Managed Care, Other (non HMO)

## 2021-10-23 VITALS — BP 104/70 | HR 71 | Temp 98.6°F | Resp 16 | Ht 60.0 in | Wt 124.0 lb

## 2021-10-23 DIAGNOSIS — D509 Iron deficiency anemia, unspecified: Secondary | ICD-10-CM

## 2021-10-23 DIAGNOSIS — E611 Iron deficiency: Secondary | ICD-10-CM

## 2021-10-23 MED ORDER — SODIUM CHLORIDE 0.9 % IV SOLN
200.0000 mg | Freq: Once | INTRAVENOUS | Status: AC
  Administered 2021-10-23: 200 mg via INTRAVENOUS
  Filled 2021-10-23: qty 10

## 2021-10-27 ENCOUNTER — Ambulatory Visit
Admission: RE | Admit: 2021-10-27 | Discharge: 2021-10-27 | Disposition: A | Discharge status: Home / Self Care | Payer: Managed Care, Other (non HMO) | Source: Ambulatory Visit | Attending: Obstetrics and Gynecology | Admitting: Obstetrics and Gynecology

## 2021-10-27 DIAGNOSIS — Z1231 Encounter for screening mammogram for malignant neoplasm of breast: Secondary | ICD-10-CM

## 2021-11-01 ENCOUNTER — Other Ambulatory Visit: Payer: Self-pay | Admitting: Obstetrics and Gynecology

## 2021-11-01 DIAGNOSIS — R928 Other abnormal and inconclusive findings on diagnostic imaging of breast: Secondary | ICD-10-CM

## 2021-11-03 ENCOUNTER — Ambulatory Visit: Payer: Managed Care, Other (non HMO)

## 2021-11-06 ENCOUNTER — Ambulatory Visit: Payer: Managed Care, Other (non HMO)

## 2021-11-06 VITALS — BP 103/69 | HR 70 | Temp 97.6°F | Resp 16 | Ht 60.0 in | Wt 126.2 lb

## 2021-11-06 DIAGNOSIS — E611 Iron deficiency: Secondary | ICD-10-CM | POA: Diagnosis not present

## 2021-11-06 MED ORDER — SODIUM CHLORIDE 0.9 % IV SOLN
200.0000 mg | Freq: Once | INTRAVENOUS | Status: AC
  Administered 2021-11-06: 200 mg via INTRAVENOUS
  Filled 2021-11-06: qty 10

## 2021-11-06 NOTE — Progress Notes (Signed)
Diagnosis: Iron Deficiency Anemia  Provider:  Marshell Garfinkel, MD  Procedure: Infusion  IV Type: Peripheral, IV Location: L Antecubital  Venofer (Iron Sucrose), Dose: 200 mg  Infusion Start Time: 3795  Infusion Stop Time: 5831  Post Infusion IV Care: Peripheral IV Discontinued  Discharge: Condition: Good, Destination: Home . AVS provided to patient.   Performed by:  Koren Shiver, RN

## 2021-11-08 ENCOUNTER — Ambulatory Visit
Admission: RE | Admit: 2021-11-08 | Discharge: 2021-11-08 | Disposition: A | Discharge status: Home / Self Care | Payer: Managed Care, Other (non HMO) | Source: Ambulatory Visit | Attending: Obstetrics and Gynecology | Admitting: Obstetrics and Gynecology

## 2021-11-08 DIAGNOSIS — R928 Other abnormal and inconclusive findings on diagnostic imaging of breast: Secondary | ICD-10-CM

## 2021-11-09 ENCOUNTER — Ambulatory Visit (INDEPENDENT_AMBULATORY_CARE_PROVIDER_SITE_OTHER): Payer: Managed Care, Other (non HMO)

## 2021-11-09 VITALS — BP 114/77 | HR 78 | Temp 98.5°F | Resp 18 | Ht 60.0 in | Wt 125.0 lb

## 2021-11-09 DIAGNOSIS — E611 Iron deficiency: Secondary | ICD-10-CM

## 2021-11-09 MED ORDER — SODIUM CHLORIDE 0.9 % IV SOLN
200.0000 mg | Freq: Once | INTRAVENOUS | Status: AC
  Administered 2021-11-09: 200 mg via INTRAVENOUS
  Filled 2021-11-09: qty 10

## 2021-11-09 NOTE — Progress Notes (Signed)
Diagnosis: Iron Deficiency Anemia  Provider:  Marshell Garfinkel, MD  Procedure: Infusion  IV Type: Peripheral, IV Location: L Antecubital  Venofer (Iron Sucrose), Dose: 200 mg  Infusion Start Time: 6720  Infusion Stop Time: 1130  Post Infusion IV Care: Peripheral IV Discontinued  Discharge: Condition: Good, Destination: Home . AVS provided to patient.   Performed by:  Koren Shiver, RN

## 2021-12-25 ENCOUNTER — Other Ambulatory Visit: Payer: Self-pay | Admitting: Internal Medicine

## 2022-01-28 ENCOUNTER — Other Ambulatory Visit: Payer: Self-pay | Admitting: Internal Medicine

## 2022-01-31 ENCOUNTER — Encounter: Payer: Self-pay | Admitting: Internal Medicine

## 2022-01-31 DIAGNOSIS — E611 Iron deficiency: Secondary | ICD-10-CM

## 2022-01-31 DIAGNOSIS — E559 Vitamin D deficiency, unspecified: Secondary | ICD-10-CM

## 2022-02-01 NOTE — Telephone Encounter (Signed)
Please advise 

## 2022-02-02 ENCOUNTER — Other Ambulatory Visit (INDEPENDENT_AMBULATORY_CARE_PROVIDER_SITE_OTHER): Payer: Managed Care, Other (non HMO)

## 2022-02-02 DIAGNOSIS — E611 Iron deficiency: Secondary | ICD-10-CM | POA: Diagnosis not present

## 2022-02-02 DIAGNOSIS — E559 Vitamin D deficiency, unspecified: Secondary | ICD-10-CM

## 2022-02-02 LAB — CBC
HCT: 36.5 % (ref 36.0–46.0)
Hemoglobin: 12.7 g/dL (ref 12.0–15.0)
MCHC: 34.8 g/dL (ref 30.0–36.0)
MCV: 85.3 fl (ref 78.0–100.0)
Platelets: 306 10*3/uL (ref 150.0–400.0)
RBC: 4.28 Mil/uL (ref 3.87–5.11)
RDW: 12.6 % (ref 11.5–15.5)
WBC: 5.6 10*3/uL (ref 4.0–10.5)

## 2022-02-02 LAB — VITAMIN D 25 HYDROXY (VIT D DEFICIENCY, FRACTURES): VITD: 30.31 ng/mL (ref 30.00–100.00)

## 2022-02-02 LAB — FERRITIN: Ferritin: 196.9 ng/mL (ref 10.0–291.0)

## 2022-03-02 ENCOUNTER — Encounter: Payer: Self-pay | Admitting: Internal Medicine

## 2022-03-02 ENCOUNTER — Ambulatory Visit: Payer: Managed Care, Other (non HMO) | Admitting: Internal Medicine

## 2022-03-02 VITALS — BP 112/80 | HR 68 | Temp 97.6°F | Ht 61.0 in | Wt 128.0 lb

## 2022-03-02 DIAGNOSIS — R399 Unspecified symptoms and signs involving the genitourinary system: Secondary | ICD-10-CM | POA: Diagnosis not present

## 2022-03-02 DIAGNOSIS — Z23 Encounter for immunization: Secondary | ICD-10-CM

## 2022-03-02 LAB — POCT URINALYSIS DIPSTICK
Bilirubin, UA: NEGATIVE
Blood, UA: 3
Glucose, UA: NEGATIVE
Ketones, UA: NEGATIVE
Nitrite, UA: NEGATIVE
Protein, UA: POSITIVE — AB
Spec Grav, UA: <=1.005 — AB (ref 1.010–1.025)
Urobilinogen, UA: NEGATIVE E.U./dL — AB
pH, UA: 6 (ref 5.0–8.0)

## 2022-03-02 MED ORDER — NITROFURANTOIN MONOHYD MACRO 100 MG PO CAPS: 100.0000 mg | ORAL_CAPSULE | Freq: Two times a day (BID) | ORAL | 0 refills | Status: AC

## 2022-03-02 NOTE — Patient Instructions (Signed)
We have sent in macrobid to take 1 pill twice a day for 5 days. 

## 2022-03-02 NOTE — Assessment & Plan Note (Signed)
POC U/A done consistent with acute cystitis. Rx macrobid 5 day course.

## 2022-03-02 NOTE — Progress Notes (Signed)
   Subjective:   Patient ID: Shelley Long, female    DOB: Sep 14, 1975, 46 y.o.   MRN: [id number]  Urinary Tract Infection  Associated symptoms include frequency and urgency. Pertinent negatives include no nausea or vomiting.   The patient is a 46 YO female coming in for possible UTI.  Review of Systems  Constitutional: Negative.   Respiratory: Negative.    Cardiovascular: Negative.   Gastrointestinal:  Negative for abdominal distention, abdominal pain, constipation, diarrhea, nausea and vomiting.  Genitourinary:  Positive for dysuria, frequency and urgency.  Musculoskeletal: Negative.   Skin: Negative.     Objective:  Physical Exam Constitutional:      Appearance: Normal appearance. She is well-developed.  HENT:     Head: Normocephalic and atraumatic.  Eyes:     Extraocular Movements: Extraocular movements intact.  Cardiovascular:     Rate and Rhythm: Normal rate and regular rhythm.  Pulmonary:     Effort: Pulmonary effort is normal. No respiratory distress.     Breath sounds: Normal breath sounds. No wheezing or rales.  Abdominal:     General: Bowel sounds are normal. There is no distension.     Palpations: Abdomen is soft.     Tenderness: There is no abdominal tenderness. There is no rebound.  Musculoskeletal:     Cervical back: Normal range of motion.  Skin:    General: Skin is warm and dry.  Neurological:     Mental Status: She is alert and oriented to person, place, and time.     Coordination: Coordination normal.     Vitals:   03/02/22 1013  BP: 112/80  Pulse: 68  Temp: 97.6 F (36.4 C)  TempSrc: Oral  SpO2: 99%  Weight: 128 lb (58.1 kg)  Height: '5\' 1"'$  (1.549 m)    Assessment & Plan:  Flu shot given at visit, Tdap given at visit

## 2022-04-11 ENCOUNTER — Other Ambulatory Visit: Payer: Self-pay | Admitting: Surgery

## 2022-04-11 DIAGNOSIS — Z8719 Personal history of other diseases of the digestive system: Secondary | ICD-10-CM

## 2022-04-11 DIAGNOSIS — M6208 Separation of muscle (nontraumatic), other site: Secondary | ICD-10-CM

## 2022-04-11 DIAGNOSIS — K580 Irritable bowel syndrome with diarrhea: Secondary | ICD-10-CM

## 2022-04-18 ENCOUNTER — Other Ambulatory Visit: Payer: Managed Care, Other (non HMO)

## 2022-04-18 ENCOUNTER — Ambulatory Visit
Admission: RE | Admit: 2022-04-18 | Discharge: 2022-04-18 | Disposition: A | Discharge status: Home / Self Care | Payer: Managed Care, Other (non HMO) | Source: Ambulatory Visit | Attending: Surgery | Admitting: Surgery

## 2022-04-18 DIAGNOSIS — Z8719 Personal history of other diseases of the digestive system: Secondary | ICD-10-CM

## 2022-04-18 DIAGNOSIS — K580 Irritable bowel syndrome with diarrhea: Secondary | ICD-10-CM

## 2022-04-18 DIAGNOSIS — M6208 Separation of muscle (nontraumatic), other site: Secondary | ICD-10-CM

## 2022-04-18 MED ORDER — IOPAMIDOL (ISOVUE-300) INJECTION 61%
100.0000 mL | Freq: Once | INTRAVENOUS | Status: AC | PRN
  Administered 2022-04-18: 100 mL via INTRAVENOUS

## 2022-08-05 ENCOUNTER — Encounter: Payer: Self-pay | Admitting: Internal Medicine

## 2022-09-18 ENCOUNTER — Ambulatory Visit: Payer: Managed Care, Other (non HMO) | Admitting: Internal Medicine

## 2022-09-18 ENCOUNTER — Encounter: Payer: Self-pay | Admitting: Internal Medicine

## 2022-09-18 VITALS — BP 118/70 | HR 82 | Temp 98.7°F | Ht 61.0 in | Wt 133.0 lb

## 2022-09-18 DIAGNOSIS — E559 Vitamin D deficiency, unspecified: Secondary | ICD-10-CM

## 2022-09-18 DIAGNOSIS — R5383 Other fatigue: Secondary | ICD-10-CM | POA: Diagnosis not present

## 2022-09-18 DIAGNOSIS — E611 Iron deficiency: Secondary | ICD-10-CM | POA: Diagnosis not present

## 2022-09-18 DIAGNOSIS — G479 Sleep disorder, unspecified: Secondary | ICD-10-CM

## 2022-09-18 DIAGNOSIS — E538 Deficiency of other specified B group vitamins: Secondary | ICD-10-CM

## 2022-09-18 LAB — COMPREHENSIVE METABOLIC PANEL
ALT: 13 U/L (ref 0–35)
AST: 17 U/L (ref 0–37)
Albumin: 4 g/dL (ref 3.5–5.2)
Alkaline Phosphatase: 67 U/L (ref 39–117)
BUN: 10 mg/dL (ref 6–23)
CO2: 27 mEq/L (ref 19–32)
Calcium: 9 mg/dL (ref 8.4–10.5)
Chloride: 101 mEq/L (ref 96–112)
Creatinine, Ser: 0.75 mg/dL (ref 0.40–1.20)
GFR: 95.69 mL/min (ref >60.00)
Glucose, Bld: 174 mg/dL — ABNORMAL HIGH (ref 70–99)
Potassium: 4.3 mEq/L (ref 3.5–5.1)
Sodium: 135 mEq/L (ref 135–145)
Total Bilirubin: 0.3 mg/dL (ref 0.2–1.2)
Total Protein: 7.2 g/dL (ref 6.0–8.3)

## 2022-09-18 LAB — CBC
HCT: 37.9 % (ref 36.0–46.0)
Hemoglobin: 12.7 g/dL (ref 12.0–15.0)
MCHC: 33.5 g/dL (ref 30.0–36.0)
MCV: 85 fl (ref 78.0–100.0)
Platelets: 349 10*3/uL (ref 150.0–400.0)
RBC: 4.45 Mil/uL (ref 3.87–5.11)
RDW: 12.2 % (ref 11.5–15.5)
WBC: 7.2 10*3/uL (ref 4.0–10.5)

## 2022-09-18 LAB — HEMOGLOBIN A1C: Hgb A1c MFr Bld: 5.4 % (ref 4.6–6.5)

## 2022-09-18 LAB — VITAMIN D 25 HYDROXY (VIT D DEFICIENCY, FRACTURES): VITD: 22.79 ng/mL — ABNORMAL LOW (ref 30.00–100.00)

## 2022-09-18 LAB — VITAMIN B12: Vitamin B-12: 333 pg/mL (ref 211–911)

## 2022-09-18 LAB — MAGNESIUM: Magnesium: 1.8 mg/dL (ref 1.5–2.5)

## 2022-09-18 LAB — FERRITIN: Ferritin: 107.7 ng/mL (ref 10.0–291.0)

## 2022-09-18 LAB — TSH: TSH: 1.2 u[IU]/mL (ref 0.35–5.50)

## 2022-09-18 LAB — FOLLICLE STIMULATING HORMONE: FSH: 5.2 m[IU]/mL

## 2022-09-18 NOTE — Assessment & Plan Note (Signed)
Still having some sleeping difficulties new in the last 3 weeks. This has coincided with worsening fatigue. Unclear trigger.

## 2022-09-18 NOTE — Assessment & Plan Note (Signed)
New fatigue so checking vitamin D level. Not taking supplement currently. Adjust as needed.

## 2022-09-18 NOTE — Assessment & Plan Note (Signed)
New fatigue so checking B12 today and adjust as needed.

## 2022-09-18 NOTE — Assessment & Plan Note (Signed)
Checking CBC and ferritin today. Adjust as needed. Periods are lighter lately and no new GI source of blood loss so hopefully this has improved.

## 2022-09-18 NOTE — Progress Notes (Signed)
   Subjective:   Patient ID: Shelley Long, female    DOB: 03-31-1976, 47 y.o.   MRN: [id number]  HPI The patient is a 47 YO female coming in for fatigue and needs follow up on vitamin deficiencies.   Review of Systems  Constitutional:  Positive for fatigue and unexpected weight change.  HENT: Negative.    Eyes: Negative.   Respiratory:  Negative for cough, chest tightness and shortness of breath.   Cardiovascular:  Negative for chest pain, palpitations and leg swelling.  Gastrointestinal:  Negative for abdominal distention, abdominal pain, constipation, diarrhea, nausea and vomiting.  Musculoskeletal: Negative.   Skin: Negative.   Neurological: Negative.   Psychiatric/Behavioral: Negative.      Objective:  Physical Exam Constitutional:      Appearance: She is well-developed.  HENT:     Head: Normocephalic and atraumatic.  Cardiovascular:     Rate and Rhythm: Normal rate and regular rhythm.  Pulmonary:     Effort: Pulmonary effort is normal. No respiratory distress.     Breath sounds: Normal breath sounds. No wheezing or rales.  Abdominal:     General: Bowel sounds are normal. There is no distension.     Palpations: Abdomen is soft.     Tenderness: There is no abdominal tenderness. There is no rebound.  Musculoskeletal:     Cervical back: Normal range of motion.  Skin:    General: Skin is warm and dry.  Neurological:     Mental Status: She is alert and oriented to person, place, and time.     Coordination: Coordination normal.     Vitals:   09/18/22 1415  BP: 118/70  Pulse: 82  Temp: 98.7 F (37.1 C)  TempSrc: Oral  SpO2: 99%  Weight: 133 lb (60.3 kg)  Height: 5\' 1"  (1.549 m)    Assessment & Plan:

## 2022-09-18 NOTE — Patient Instructions (Addendum)
We will check the labs today. 

## 2022-09-18 NOTE — Assessment & Plan Note (Signed)
New fatigue and weight gain. Checking vitamin levels B12 and D and iron. Checking TSH and CBC and CMP as well. Checking FSH for hormonal levels in case of perimenopause.

## 2022-09-19 LAB — INSULIN, RANDOM: Insulin: 123.9 u[IU]/mL — ABNORMAL HIGH

## 2022-11-08 ENCOUNTER — Ambulatory Visit
Admission: RE | Admit: 2022-11-08 | Discharge: 2022-11-08 | Disposition: A | Discharge status: Home / Self Care | Payer: Managed Care, Other (non HMO) | Source: Ambulatory Visit | Attending: Obstetrics and Gynecology | Admitting: Obstetrics and Gynecology

## 2022-11-08 ENCOUNTER — Other Ambulatory Visit: Payer: Self-pay | Admitting: Obstetrics and Gynecology

## 2022-11-08 DIAGNOSIS — Z1231 Encounter for screening mammogram for malignant neoplasm of breast: Secondary | ICD-10-CM

## 2023-03-08 ENCOUNTER — Encounter: Payer: Self-pay | Admitting: Internal Medicine

## 2023-03-08 DIAGNOSIS — R5383 Other fatigue: Secondary | ICD-10-CM

## 2023-03-08 DIAGNOSIS — E559 Vitamin D deficiency, unspecified: Secondary | ICD-10-CM

## 2023-03-12 ENCOUNTER — Encounter: Payer: Managed Care, Other (non HMO) | Admitting: Internal Medicine

## 2023-03-13 ENCOUNTER — Ambulatory Visit: Payer: Managed Care, Other (non HMO) | Admitting: Internal Medicine

## 2023-03-13 ENCOUNTER — Encounter: Payer: Self-pay | Admitting: Internal Medicine

## 2023-03-13 VITALS — BP 122/84 | HR 72 | Temp 98.5°F | Ht 61.0 in | Wt 137.0 lb

## 2023-03-13 DIAGNOSIS — R5383 Other fatigue: Secondary | ICD-10-CM | POA: Diagnosis not present

## 2023-03-13 DIAGNOSIS — E559 Vitamin D deficiency, unspecified: Secondary | ICD-10-CM

## 2023-03-13 DIAGNOSIS — Z23 Encounter for immunization: Secondary | ICD-10-CM

## 2023-03-13 DIAGNOSIS — Z0001 Encounter for general adult medical examination with abnormal findings: Secondary | ICD-10-CM | POA: Diagnosis not present

## 2023-03-13 DIAGNOSIS — E538 Deficiency of other specified B group vitamins: Secondary | ICD-10-CM

## 2023-03-13 DIAGNOSIS — Z0000 Encounter for general adult medical examination without abnormal findings: Secondary | ICD-10-CM

## 2023-03-13 DIAGNOSIS — E611 Iron deficiency: Secondary | ICD-10-CM | POA: Diagnosis not present

## 2023-03-13 LAB — CBC
HCT: 37.9 % (ref 36.0–46.0)
Hemoglobin: 12.7 g/dL (ref 12.0–15.0)
MCHC: 33.5 g/dL (ref 30.0–36.0)
MCV: 84.5 fL (ref 78.0–100.0)
Platelets: 384 10*3/uL (ref 150.0–400.0)
RBC: 4.48 Mil/uL (ref 3.87–5.11)
RDW: 12.5 % (ref 11.5–15.5)
WBC: 6.6 10*3/uL (ref 4.0–10.5)

## 2023-03-13 LAB — FERRITIN: Ferritin: 44.1 ng/mL (ref 10.0–291.0)

## 2023-03-13 LAB — COMPREHENSIVE METABOLIC PANEL
ALT: 12 U/L (ref 0–35)
AST: 18 U/L (ref 0–37)
Albumin: 4.2 g/dL (ref 3.5–5.2)
Alkaline Phosphatase: 69 U/L (ref 39–117)
BUN: 9 mg/dL (ref 6–23)
CO2: 26 meq/L (ref 19–32)
Calcium: 9.5 mg/dL (ref 8.4–10.5)
Chloride: 104 meq/L (ref 96–112)
Creatinine, Ser: 0.76 mg/dL (ref 0.40–1.20)
GFR: 93.86 mL/min (ref >60.00)
Glucose, Bld: 95 mg/dL (ref 70–99)
Potassium: 4.6 meq/L (ref 3.5–5.1)
Sodium: 135 meq/L (ref 135–145)
Total Bilirubin: 0.3 mg/dL (ref 0.2–1.2)
Total Protein: 7.7 g/dL (ref 6.0–8.3)

## 2023-03-13 LAB — TSH: TSH: 1.76 u[IU]/mL (ref 0.35–5.50)

## 2023-03-13 LAB — VITAMIN B12: Vitamin B-12: 284 pg/mL (ref 211–911)

## 2023-03-13 LAB — FOLATE: Folate: 16.4 ng/mL (ref >5.9)

## 2023-03-13 LAB — VITAMIN D 25 HYDROXY (VIT D DEFICIENCY, FRACTURES): VITD: 54.23 ng/mL (ref 30.00–100.00)

## 2023-03-13 NOTE — Patient Instructions (Addendum)
Try the geneconnect study through [facility] to get the brca testing for free. [email or web address]

## 2023-03-13 NOTE — Progress Notes (Signed)
   Subjective:   Patient ID: Shelley Long, female    DOB: 12/18/1975, 47 y.o.   MRN: [id number]  HPI The patient is here for physical.  PMH, [facility], social history reviewed and updated  Review of Systems  Constitutional:  Positive for fatigue.  HENT: Negative.    Eyes: Negative.   Respiratory:  Negative for cough, chest tightness and shortness of breath.   Cardiovascular:  Negative for chest pain, palpitations and leg swelling.  Gastrointestinal:  Negative for abdominal distention, abdominal pain, constipation, diarrhea, nausea and vomiting.  Musculoskeletal: Negative.   Skin: Negative.   Neurological: Negative.   Psychiatric/Behavioral: Negative.      Objective:  Physical Exam Constitutional:      Appearance: She is well-developed.  HENT:     Head: Normocephalic and atraumatic.  Cardiovascular:     Rate and Rhythm: Normal rate and regular rhythm.  Pulmonary:     Effort: Pulmonary effort is normal. No respiratory distress.     Breath sounds: Normal breath sounds. No wheezing or rales.  Abdominal:     General: Bowel sounds are normal. There is no distension.     Palpations: Abdomen is soft.     Tenderness: There is no abdominal tenderness. There is no rebound.  Musculoskeletal:     Cervical back: Normal range of motion.  Skin:    General: Skin is warm and dry.  Neurological:     Mental Status: She is alert and oriented to person, place, and time.     Coordination: Coordination normal.     Vitals:   03/13/23 0943  BP: 122/84  Pulse: 72  Temp: 98.5 F (36.9 C)  TempSrc: Oral  SpO2: 98%  Weight: 137 lb (62.1 kg)  Height: 5\' 1"  (1.549 m)    Assessment & Plan:  Flu shot given at visit

## 2023-03-14 NOTE — Assessment & Plan Note (Signed)
Flu shot given. Tetanus up to date. Colonoscopy up to date. Mammogram up to date, pap smear up to date with gyn. Counseled about sun safety and mole surveillance. Counseled about the dangers of distracted driving. Given 10 year screening recommendations.

## 2023-03-14 NOTE — Assessment & Plan Note (Signed)
Worsening fatigue so checking vitamin D level today and adjust as needed.

## 2023-03-14 NOTE — Assessment & Plan Note (Signed)
Worsening fatigue and no multivitamin lately. Checking ferritin and CBC.

## 2023-03-14 NOTE — Assessment & Plan Note (Signed)
Worsening fatigue in last 4 weeks and has stopped B12 supplement in that time. Checking B12 and folate levels.

## 2023-06-23 ENCOUNTER — Encounter: Payer: Self-pay | Admitting: Internal Medicine

## 2023-12-04 ENCOUNTER — Ambulatory Visit: Payer: Self-pay

## 2023-12-04 ENCOUNTER — Ambulatory Visit: Admitting: Emergency Medicine

## 2023-12-04 NOTE — Telephone Encounter (Signed)
 FYI Only or Action Required?: FYI only for provider.  Patient was last seen in primary care on 03/13/2023 by Rollene Almarie LABOR, MD.  Called Nurse Triage reporting Headache.  Symptoms began x 3 days.  Interventions attempted: Nothing.  Symptoms are: unchanged.  Triage Disposition: See HCP Within 4 Hours (Or PCP Triage)  Patient/caregiver understands and will follow disposition?: Yes  Copied from CRM #[id number]. Topic: Clinical - Red Word Triage >> Dec 04, 2023 12:55 PM Chiquita SQUIBB wrote: Red Word that prompted transfer to Nurse Triage: Patient is calling in stating she has severe pain in her head for 3 days and no over the counter medication has helped the pain.    ----------------------------------------------------------------------- From previous Reason for Contact - Scheduling: Patient/patient representative is calling to schedule an appointment. Refer to attachments for appointment information. Reason for Disposition  [1] SEVERE neck pain (e.g., excruciating, unable to do any normal activities) AND [2] not improved after 2 hours of pain medicine  Additional Information  Negative: Numbness in an arm or hand (i.e., loss of sensation)    Headache and neck stiffness  Answer Assessment - Initial Assessment Questions 1. LOCATION: Where does it hurt?      Eyes, forehead, and then moves to bottom of head as well 2. ONSET: When did the headache start? (e.g., minutes, hours, days)      X 3 days 3. PATTERN: Does the pain come and go, or has it been constant since it started?     Constant  4. SEVERITY: How bad is the pain? and What does it keep you from doing?  (e.g., Scale 1-10; mild, moderate, or severe)     7/10 5. RECURRENT SYMPTOM: Have you ever had headaches before? If Yes, ask: When was the last time? and What happened that time?      Yes - it was r/t allergies at first 6. CAUSE: What do you think is causing the headache?     unknown 7. MIGRAINE: Have you  been diagnosed with migraine headaches? If Yes, ask: Is this headache similar?      no 8. HEAD INJURY: Has there been any recent injury to your head?      no 9. OTHER SYMPTOMS: Do you have any other symptoms? (e.g., fever, stiff neck, eye pain, sore throat, cold symptoms)     Neck pain/stiffness - since headache, warm to touch, weakness 10. PREGNANCY: Is there any chance you are pregnant? When was your last menstrual period?       na Pt takes allergy  medication daily - however missed Friday and Saturday & started retaking but no relief.  Answer Assessment - Initial Assessment Questions 1. ONSET: When did the pain begin?      X 3 days 2. LOCATION: Where does it hurt?      Stiffness in neck 3. PATTERN Does the pain come and go, or has it been constant since it started?      Constant  - pt thought she may have slept wrong 4. SEVERITY: How bad is the pain?  (Scale 0-10; or none or slight stiffness, mild, moderate, severe)     Moderate  5. RADIATION: Does the pain go anywhere else, shoot into your arms?     no 6. CORD SYMPTOMS: Any weakness or numbness of the arms or legs?     Na 7. CAUSE: What do you think is causing the neck pain?     Unknown  8. NECK OVERUSE: Any recent activities that involved turning  or twisting the neck?     no 9. OTHER SYMPTOMS: Do you have any other symptoms? (e.g., headache, fever, chest pain, difficulty breathing, neck swelling)     Headache, low grade fever 10. PREGNANCY: Is there any chance you are pregnant? When was your last menstrual period?       na  Protocols used: Headache-A-AH, Neck Pain or Stiffness-A-AH

## 2023-12-06 ENCOUNTER — Ambulatory Visit: Admitting: Internal Medicine

## 2023-12-16 ENCOUNTER — Ambulatory Visit: Admitting: Internal Medicine

## 2024-01-28 ENCOUNTER — Encounter: Payer: Self-pay | Admitting: Internal Medicine

## 2024-01-28 ENCOUNTER — Ambulatory Visit: Admitting: Internal Medicine

## 2024-01-28 VITALS — BP 102/68 | HR 63 | Temp 97.8°F | Ht 61.0 in | Wt 136.0 lb

## 2024-01-28 DIAGNOSIS — E559 Vitamin D deficiency, unspecified: Secondary | ICD-10-CM

## 2024-01-28 DIAGNOSIS — R5383 Other fatigue: Secondary | ICD-10-CM

## 2024-01-28 DIAGNOSIS — E611 Iron deficiency: Secondary | ICD-10-CM | POA: Diagnosis not present

## 2024-01-28 DIAGNOSIS — K529 Noninfective gastroenteritis and colitis, unspecified: Secondary | ICD-10-CM

## 2024-01-28 DIAGNOSIS — E538 Deficiency of other specified B group vitamins: Secondary | ICD-10-CM | POA: Diagnosis not present

## 2024-01-28 DIAGNOSIS — Z23 Encounter for immunization: Secondary | ICD-10-CM

## 2024-01-28 LAB — T4, FREE: Free T4: 0.91 ng/dL (ref 0.60–1.60)

## 2024-01-28 LAB — LIPASE: Lipase: 47 U/L (ref 11.0–59.0)

## 2024-01-28 LAB — COMPREHENSIVE METABOLIC PANEL WITH GFR
ALT: 11 U/L (ref 0–35)
AST: 15 U/L (ref 0–37)
Albumin: 4.1 g/dL (ref 3.5–5.2)
Alkaline Phosphatase: 63 U/L (ref 39–117)
BUN: 12 mg/dL (ref 6–23)
CO2: 26 meq/L (ref 19–32)
Calcium: 9 mg/dL (ref 8.4–10.5)
Chloride: 103 meq/L (ref 96–112)
Creatinine, Ser: 0.69 mg/dL (ref 0.40–1.20)
GFR: 103.32 mL/min (ref >60.00)
Glucose, Bld: 94 mg/dL (ref 70–99)
Potassium: 3.8 meq/L (ref 3.5–5.1)
Sodium: 135 meq/L (ref 135–145)
Total Bilirubin: 0.3 mg/dL (ref 0.2–1.2)
Total Protein: 7.4 g/dL (ref 6.0–8.3)

## 2024-01-28 LAB — CBC
HCT: 36.3 % (ref 36.0–46.0)
Hemoglobin: 12.2 g/dL (ref 12.0–15.0)
MCHC: 33.5 g/dL (ref 30.0–36.0)
MCV: 82.7 fl (ref 78.0–100.0)
Platelets: 359 K/uL (ref 150.0–400.0)
RBC: 4.39 Mil/uL (ref 3.87–5.11)
RDW: 12.9 % (ref 11.5–15.5)
WBC: 6 K/uL (ref 4.0–10.5)

## 2024-01-28 LAB — FERRITIN: Ferritin: 21.2 ng/mL (ref 10.0–291.0)

## 2024-01-28 LAB — FOLATE: Folate: 16.2 ng/mL (ref >5.9)

## 2024-01-28 LAB — HEMOGLOBIN A1C: Hgb A1c MFr Bld: 5.6 % (ref 4.6–6.5)

## 2024-01-28 LAB — TSH: TSH: 1.18 u[IU]/mL (ref 0.35–5.50)

## 2024-01-28 LAB — VITAMIN B12: Vitamin B-12: 325 pg/mL (ref 211–911)

## 2024-01-28 NOTE — Patient Instructions (Addendum)
 We will check all the labs today.

## 2024-01-28 NOTE — Progress Notes (Unsigned)
 Subjective:   Patient ID: Shelley Long, female    DOB: [birth year], 48 y.o.   MRN: [id number]  Discussed the use of AI scribe software for clinical note transcription with the patient, who gave verbal consent to proceed.  History of Present Illness Shelley Long is a 48 year old female who presents with gastrointestinal symptoms and dizziness.  She has been experiencing diarrhea that began after consuming alcohol during a conference three weeks ago. Although the diarrhea has slowed, her stools remain abnormal. She has a history of small intestinal bacterial overgrowth (SIBO) and is worried about its recurrence, especially given her symptoms of bloating and abnormal stools. A previous episode of SIBO lasted 18 months and required antibiotic treatment.  She describes severe bloating, feeling 'super bloated' even without eating, and severe heartburn throughout the day, which has improved over the last two to three days. The heartburn was present even before eating. She is concerned about her nutritional absorption, particularly her iron  and B12 levels, as she has experienced drops in these levels during past episodes of gastrointestinal issues.  About a week ago, she experienced a severe episode of dizziness, described as 'spinning' and feeling like she might fall off a chair. She also had a headache during this episode and considered going to urgent care but decided to wait it out. Although she feels better now, she still experiences weakness and fatigue.  She mentions a family history of early menopause, noting that her mother experienced similar symptoms around the same age. She is concerned about being in the perimenopausal phase and its potential impact on her symptoms.  She recently resumed working out three weeks ago but feels weaker than usual, describing it as starting from 'zero again.'  Review of Systems  Constitutional: Negative.   HENT: Negative.    Eyes: Negative.   Respiratory:   Negative for cough, chest tightness and shortness of breath.   Cardiovascular:  Negative for chest pain, palpitations and leg swelling.  Gastrointestinal:  Positive for abdominal pain and diarrhea. Negative for abdominal distention, constipation, nausea and vomiting.  Musculoskeletal: Negative.   Skin: Negative.   Neurological:  Positive for dizziness.  Psychiatric/Behavioral: Negative.      Objective:  Physical Exam Constitutional:      Appearance: She is well-developed.  HENT:     Head: Normocephalic and atraumatic.  Cardiovascular:     Rate and Rhythm: Normal rate and regular rhythm.  Pulmonary:     Effort: Pulmonary effort is normal. No respiratory distress.     Breath sounds: Normal breath sounds. No wheezing or rales.  Abdominal:     General: Bowel sounds are normal. There is no distension.     Palpations: Abdomen is soft.     Tenderness: There is no abdominal tenderness.  Musculoskeletal:     Cervical back: Normal range of motion.  Skin:    General: Skin is warm and dry.  Neurological:     Mental Status: She is alert and oriented to person, place, and time.     Coordination: Coordination normal.     Vitals:   01/28/24 1540  BP: 102/68  Pulse: 63  Temp: 97.8 F (36.6 C)  TempSrc: Temporal  SpO2: 98%  Weight: 136 lb (61.7 kg)  Height: 5' 1 (1.549 m)    Assessment and Plan Assessment & Plan Gastrointestinal symptoms (bloating, abnormal stool, heartburn)   Symptoms have improved, indicating a dietary trigger rather than SIBO. Differential diagnosis includes dietary changes, alcohol effects, and  viral infection, with a low likelihood of SIBO recurrence. Order blood tests for ferritin, B12, vitamin D , and A1c. Consider SIBO evaluation if symptoms persist or worsen.  Dizziness/vertigo, recent episode   A recent dizziness episode is likely due to peripheral vertigo. There is no evidence of central vertigo or ear infection. No ENT referral is needed unless symptoms  persist or recur.  Perimenopausal state   Perimenopausal symptoms are likely due to hormonal changes, with a family history suggesting early menopause. Previous FSH levels are available for comparison. Order an [facility] level to assess menopausal status and compare with previous results.  Fatigue/weakness   Persistent fatigue may be linked to gastrointestinal symptoms and nutritional deficiencies, with increased physical activity as a possible contributing factor. Order blood tests for ferritin, B12, vitamin D , and thyroid  function.  General Health Maintenance   She has received a flu shot in past years and plans to receive it again. Administer the flu shot during this visit.

## 2024-01-29 LAB — VITAMIN D 25 HYDROXY (VIT D DEFICIENCY, FRACTURES): VITD: 43.8 ng/mL (ref 30.00–100.00)

## 2024-01-29 LAB — FOLLICLE STIMULATING HORMONE: FSH: 4.6 m[IU]/mL

## 2024-01-30 ENCOUNTER — Ambulatory Visit: Payer: Self-pay | Admitting: Internal Medicine

## 2024-01-30 NOTE — Assessment & Plan Note (Signed)
 Checking vitamin-D.

## 2024-01-30 NOTE — Assessment & Plan Note (Signed)
 Persistent fatigue may be linked to gastrointestinal symptoms and nutritional deficiencies, with increased physical activity as a possible contributing factor. Order blood tests for ferritin, B12, vitamin D , and thyroid  function.

## 2024-01-30 NOTE — Assessment & Plan Note (Signed)
 Symptoms have improved, indicating a dietary trigger rather than SIBO. Differential diagnosis includes dietary changes, alcohol effects, and viral infection, with a low likelihood of SIBO recurrence. Order blood tests for ferritin, B12, vitamin D , and A1c. Consider SIBO evaluation if symptoms persist or worsen.

## 2024-01-30 NOTE — Assessment & Plan Note (Signed)
 Checking ferritin.

## 2024-01-30 NOTE — Assessment & Plan Note (Signed)
Checking B12 level.  °

## 2024-02-04 ENCOUNTER — Other Ambulatory Visit: Payer: Self-pay | Admitting: Medical

## 2024-02-04 DIAGNOSIS — Z1231 Encounter for screening mammogram for malignant neoplasm of breast: Secondary | ICD-10-CM

## 2024-02-11 ENCOUNTER — Ambulatory Visit
Admission: RE | Admit: 2024-02-11 | Discharge: 2024-02-11 | Disposition: A | Source: Ambulatory Visit | Attending: Medical | Admitting: Medical

## 2024-02-11 DIAGNOSIS — Z1231 Encounter for screening mammogram for malignant neoplasm of breast: Secondary | ICD-10-CM

## 2024-02-14 ENCOUNTER — Ambulatory Visit: Payer: Self-pay | Admitting: Medical

## 2024-02-14 NOTE — Progress Notes (Signed)
 This result came to me inadvertently.  Forwarding to you

## 2024-04-08 ENCOUNTER — Ambulatory Visit: Admitting: Internal Medicine

## 2024-04-08 ENCOUNTER — Encounter: Payer: Self-pay | Admitting: Internal Medicine

## 2024-04-08 VITALS — BP 100/70 | HR 72 | Temp 98.1°F | Ht 61.0 in | Wt 132.0 lb

## 2024-04-08 DIAGNOSIS — Z0000 Encounter for general adult medical examination without abnormal findings: Secondary | ICD-10-CM

## 2024-04-08 DIAGNOSIS — R5383 Other fatigue: Secondary | ICD-10-CM

## 2024-04-08 DIAGNOSIS — Z13220 Encounter for screening for lipoid disorders: Secondary | ICD-10-CM

## 2024-04-08 DIAGNOSIS — E611 Iron deficiency: Secondary | ICD-10-CM

## 2024-04-08 DIAGNOSIS — E559 Vitamin D deficiency, unspecified: Secondary | ICD-10-CM

## 2024-04-08 DIAGNOSIS — Z1159 Encounter for screening for other viral diseases: Secondary | ICD-10-CM

## 2024-04-08 DIAGNOSIS — E538 Deficiency of other specified B group vitamins: Secondary | ICD-10-CM

## 2024-04-08 LAB — LIPID PANEL
Cholesterol: 198 mg/dL (ref 0–200)
HDL: 52.1 mg/dL (ref >39.00)
LDL Cholesterol: 118 mg/dL — ABNORMAL HIGH (ref 0–99)
NonHDL: 146.2
Total CHOL/HDL Ratio: 4
Triglycerides: 143 mg/dL (ref 0.0–149.0)
VLDL: 28.6 mg/dL (ref 0.0–40.0)

## 2024-04-08 LAB — COMPREHENSIVE METABOLIC PANEL WITH GFR
ALT: 21 U/L (ref 0–35)
AST: 21 U/L (ref 0–37)
Albumin: 4 g/dL (ref 3.5–5.2)
Alkaline Phosphatase: 81 U/L (ref 39–117)
BUN: 12 mg/dL (ref 6–23)
CO2: 27 meq/L (ref 19–32)
Calcium: 9 mg/dL (ref 8.4–10.5)
Chloride: 105 meq/L (ref 96–112)
Creatinine, Ser: 0.68 mg/dL (ref 0.40–1.20)
GFR: 103.54 mL/min (ref >60.00)
Glucose, Bld: 95 mg/dL (ref 70–99)
Potassium: 4.3 meq/L (ref 3.5–5.1)
Sodium: 137 meq/L (ref 135–145)
Total Bilirubin: 0.3 mg/dL (ref 0.2–1.2)
Total Protein: 7.2 g/dL (ref 6.0–8.3)

## 2024-04-08 LAB — CBC
HCT: 35.5 % — ABNORMAL LOW (ref 36.0–46.0)
Hemoglobin: 12.3 g/dL (ref 12.0–15.0)
MCHC: 34.6 g/dL (ref 30.0–36.0)
MCV: 80.7 fl (ref 78.0–100.0)
Platelets: 332 K/uL (ref 150.0–400.0)
RBC: 4.4 Mil/uL (ref 3.87–5.11)
RDW: 12.7 % (ref 11.5–15.5)
WBC: 6.1 K/uL (ref 4.0–10.5)

## 2024-04-08 LAB — VITAMIN B12: Vitamin B-12: 299 pg/mL (ref 211–911)

## 2024-04-08 LAB — FERRITIN: Ferritin: 27.6 ng/mL (ref 10.0–291.0)

## 2024-04-08 LAB — T4, FREE: Free T4: 0.82 ng/dL (ref 0.60–1.60)

## 2024-04-08 LAB — VITAMIN D 25 HYDROXY (VIT D DEFICIENCY, FRACTURES): VITD: 45.5 ng/mL (ref 30.00–100.00)

## 2024-04-08 LAB — TSH: TSH: 1.74 u[IU]/mL (ref 0.35–5.50)

## 2024-04-08 MED ORDER — DICLOFENAC SODIUM 1 % EX GEL: 2.0000 g | Freq: Four times a day (QID) | CUTANEOUS | 0 refills | Status: AC

## 2024-04-08 NOTE — Progress Notes (Signed)
 Subjective:   Patient ID: Shelley Long, female    DOB: 11/25/1975, 48 y.o.   MRN: [id number]  The patient is here for physical. Pertinent topics discussed: Discussed the use of AI scribe software for clinical note transcription with the patient, who gave verbal consent to proceed.  History of Present Illness Shelley Long is a 48 year old female who presents with eye discomfort and elbow pain.  She experiences eye discomfort characterized by dryness and pain, particularly during the winter months. The discomfort is exacerbated at night, leading to significant eye strain and difficulty reading. She suspects allergies, poor eyesight, or the use of a heater as potential causes. She takes Xyzal for allergies.  She has been experiencing elbow pain for the past six months, described as similar to 'tennis elbow' or tendonitis. Despite attempting exercises found on YouTube, there has been no improvement in her right elbow, and she now feels similar symptoms in her left elbow.  She has a history of Barrett's esophagus and takes Pepcid  when consuming spicy foods. She notes a pattern where taking Pepcid  one day leads to a perceived need to take it the following day, although she is unsure why this occurs.  She reports feeling tired and attributes some of this to potential perimenopausal symptoms. She experiences mood swings, fatigue, and lack of motivation, particularly around her menstrual cycle. She describes feeling 'low' and 'unmotivated' after her last period, which occurred last week. She takes magnesium, vitamin D , and omega-3 supplements. Walking helps improve her digestion and mood.  She has a family history of her mother using antidepressants and anti-anxiety medications, which started around the same age she is now.  PMH, [facility], social history reviewed and updated  Review of Systems  Constitutional: Negative.  Negative for fatigue, fever and unexpected weight change.  HENT: Negative.  Negative  for ear discharge, ear pain, sinus pain, sneezing, sore throat, tinnitus, trouble swallowing and voice change.   Eyes: Negative.   Respiratory:  Negative for cough, chest tightness, shortness of breath and wheezing.   Cardiovascular: Negative.  Negative for chest pain, palpitations and leg swelling.  Gastrointestinal:  Positive for abdominal distention. Negative for abdominal pain, constipation, diarrhea, nausea and vomiting.  Musculoskeletal:  Positive for myalgias.  Skin: Negative.   Neurological: Negative.   Psychiatric/Behavioral: Negative.      Objective:  Physical Exam Constitutional:      Appearance: She is well-developed.  HENT:     Head: Normocephalic and atraumatic.  Cardiovascular:     Rate and Rhythm: Normal rate and regular rhythm.  Pulmonary:     Effort: Pulmonary effort is normal. No respiratory distress.     Breath sounds: Normal breath sounds. No wheezing or rales.  Abdominal:     General: Bowel sounds are normal. There is no distension.     Palpations: Abdomen is soft.     Tenderness: There is no abdominal tenderness.  Musculoskeletal:        General: Tenderness present.     Cervical back: Normal range of motion.  Skin:    General: Skin is warm and dry.  Neurological:     Mental Status: She is alert and oriented to person, place, and time.     Coordination: Coordination normal.     Vitals:   04/08/24 0940  BP: 100/70  Pulse: 72  Temp: 98.1 F (36.7 C)  TempSrc: Oral  SpO2: 99%  Weight: 132 lb (59.9 kg)  Height: 5' 1 (1.549 m)  Assessment & Plan:

## 2024-04-08 NOTE — Patient Instructions (Signed)
 We have sent in the voltaren gel to use 3-4 times a day for 1-2 weeks on the elbows to get this better.

## 2024-04-09 ENCOUNTER — Encounter: Payer: Self-pay | Admitting: Internal Medicine

## 2024-04-09 DIAGNOSIS — M7711 Lateral epicondylitis, right elbow: Secondary | ICD-10-CM | POA: Insufficient documentation

## 2024-04-09 LAB — HEPATITIS C ANTIBODY: Hepatitis C Ab: NONREACTIVE

## 2024-04-09 NOTE — Assessment & Plan Note (Signed)
 Checking vitamin D  and TSH and B12 and ferritin and CMP and CBC to assess worsening fatigue.

## 2024-04-09 NOTE — Assessment & Plan Note (Signed)
 Flu shot up to date. Tetanus up to date. Colonoscopy up to date. Mammogram uptodate, pap smear up to date. Counseled about sun safety and mole surveillance. Counseled about the dangers of distracted driving. Given 10 year screening recommendations.

## 2024-04-09 NOTE — Assessment & Plan Note (Signed)
 Checking vitamin D  and adjust as needed.

## 2024-04-09 NOTE — Assessment & Plan Note (Signed)
 Checking ferritin for worsening fatigue.

## 2024-04-09 NOTE — Assessment & Plan Note (Signed)
 Worsening fatigue rechecking B12 levels.

## 2024-04-09 NOTE — Assessment & Plan Note (Signed)
 Rx voltaren gel 1% to use on bilateral elbow area. If no resolution will refer to sports medicine for injection.

## 2024-04-13 ENCOUNTER — Ambulatory Visit: Payer: Self-pay | Admitting: Internal Medicine

## 2024-04-30 ENCOUNTER — Encounter: Payer: Self-pay | Admitting: Internal Medicine
